# Patient Record
Sex: Female | Born: 1995 | Race: Black or African American | Hispanic: No | Marital: Single | Smoking: Never smoker
Health system: Southern US, Community
[De-identification: ages and names within clinical notes are randomized; demographics above are authoritative.]

## PROBLEM LIST (undated history)

## (undated) ENCOUNTER — Inpatient Hospital Stay (HOSPITAL_COMMUNITY): Payer: Self-pay

## (undated) ENCOUNTER — Inpatient Hospital Stay (HOSPITAL_COMMUNITY): Payer: Medicaid Other

## (undated) DIAGNOSIS — Z202 Contact with and (suspected) exposure to infections with a predominantly sexual mode of transmission: Secondary | ICD-10-CM

## (undated) DIAGNOSIS — Z8619 Personal history of other infectious and parasitic diseases: Secondary | ICD-10-CM

## (undated) HISTORY — PX: NO PAST SURGERIES: SHX2092

## (undated) HISTORY — DX: Personal history of other infectious and parasitic diseases: Z86.19

---
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
## 2017-09-27 ENCOUNTER — Inpatient Hospital Stay (HOSPITAL_COMMUNITY)
Admission: AD | Admit: 2017-09-27 | Discharge: 2017-09-27 | Disposition: A | Discharge status: Home / Self Care | Payer: Medicaid Other | Source: Ambulatory Visit | Attending: Obstetrics and Gynecology | Admitting: Obstetrics and Gynecology

## 2017-09-27 ENCOUNTER — Encounter (HOSPITAL_COMMUNITY): Payer: Self-pay | Admitting: *Deleted

## 2017-09-27 ENCOUNTER — Inpatient Hospital Stay (HOSPITAL_COMMUNITY): Payer: Medicaid Other

## 2017-09-27 DIAGNOSIS — M549 Dorsalgia, unspecified: Secondary | ICD-10-CM | POA: Insufficient documentation

## 2017-09-27 DIAGNOSIS — R1084 Generalized abdominal pain: Secondary | ICD-10-CM

## 2017-09-27 DIAGNOSIS — R109 Unspecified abdominal pain: Secondary | ICD-10-CM

## 2017-09-27 DIAGNOSIS — R1110 Vomiting, unspecified: Secondary | ICD-10-CM | POA: Insufficient documentation

## 2017-09-27 DIAGNOSIS — Z825 Family history of asthma and other chronic lower respiratory diseases: Secondary | ICD-10-CM | POA: Diagnosis not present

## 2017-09-27 LAB — URINALYSIS, ROUTINE W REFLEX MICROSCOPIC
Bilirubin Urine: NEGATIVE
GLUCOSE, UA: NEGATIVE mg/dL
Hgb urine dipstick: NEGATIVE
Ketones, ur: 5 mg/dL — AB
Leukocytes, UA: NEGATIVE
NITRITE: NEGATIVE
Protein, ur: 30 mg/dL — AB
SPECIFIC GRAVITY, URINE: 1.026 (ref 1.005–1.030)
pH: 5 (ref 5.0–8.0)

## 2017-09-27 LAB — POCT PREGNANCY, URINE: Preg Test, Ur: NEGATIVE

## 2017-09-27 LAB — WET PREP, GENITAL
Sperm: NONE SEEN
Trich, Wet Prep: NONE SEEN
Yeast Wet Prep HPF POC: NONE SEEN

## 2017-09-27 LAB — COMPREHENSIVE METABOLIC PANEL
ALK PHOS: 53 U/L (ref 38–126)
ALT: 15 U/L (ref 0–44)
ANION GAP: 11 (ref 5–15)
AST: 17 U/L (ref 15–41)
Albumin: 4.2 g/dL (ref 3.5–5.0)
BILIRUBIN TOTAL: 1.2 mg/dL (ref 0.3–1.2)
BUN: 8 mg/dL (ref 6–20)
CO2: 21 mmol/L — ABNORMAL LOW (ref 22–32)
Calcium: 9 mg/dL (ref 8.9–10.3)
Chloride: 102 mmol/L (ref 98–111)
Creatinine, Ser: 0.63 mg/dL (ref 0.44–1.00)
GFR calc non Af Amer: >60 mL/min (ref >60)
Glucose, Bld: 106 mg/dL — ABNORMAL HIGH (ref 70–99)
Potassium: 3.6 mmol/L (ref 3.5–5.1)
Sodium: 134 mmol/L — ABNORMAL LOW (ref 135–145)
TOTAL PROTEIN: 8.3 g/dL — AB (ref 6.5–8.1)

## 2017-09-27 LAB — CBC
HCT: 37.2 % (ref 36.0–46.0)
HEMOGLOBIN: 12.9 g/dL (ref 12.0–15.0)
MCH: 29.4 pg (ref 26.0–34.0)
MCHC: 34.7 g/dL (ref 30.0–36.0)
MCV: 84.7 fL (ref 78.0–100.0)
Platelets: 320 10*3/uL (ref 150–400)
RBC: 4.39 MIL/uL (ref 3.87–5.11)
RDW: 12.3 % (ref 11.5–15.5)
WBC: 12.3 10*3/uL — AB (ref 4.0–10.5)

## 2017-09-27 MED ORDER — IBUPROFEN 800 MG PO TABS
400.0000 mg | ORAL_TABLET | Freq: Once | ORAL | Status: AC
  Administered 2017-09-27: 400 mg via ORAL
  Filled 2017-09-27: qty 1

## 2017-09-27 MED ORDER — FLEET ENEMA 7-19 GM/118ML RE ENEM: 1.0000 | ENEMA | Freq: Once | RECTAL | Status: DC

## 2017-09-27 MED ORDER — FLEET ENEMA 7-19 GM/118ML RE ENEM
1.0000 | ENEMA | Freq: Once | RECTAL | Status: AC
  Administered 2017-09-27: 1 via RECTAL

## 2017-09-27 NOTE — MAU Provider Note (Signed)
Patient Linda Aguirre is a 22 y.o. G11P2002 Non-pregnant female here with complaints of abdominal pain and throwing up since last night at 8 pm. She denies fever, chills, HA, dysuria, abnormal discharge or other ob-gyn complaint.  History     CSN: [id number]  Arrival date and time: 09/27/17 1341   None     Chief Complaint  Patient presents with  . Abdominal Pain  . Back Pain  . Emesis   Abdominal Pain  This is a new problem. The current episode started yesterday. The onset quality is sudden. The problem occurs intermittently. The pain is located in the generalized abdominal region. The pain is at a severity of 8/10. The quality of the pain is cramping. The abdominal pain does not radiate. Associated symptoms include vomiting.  Back Pain  Associated symptoms include abdominal pain.  Emesis   Associated symptoms include abdominal pain.   The patient [place] 3, and her pain started last night at 8pm.  The patient has chronic back pain; this is not new pain for her. SHe is also constipated; she says that she has not had a BM for the past three days.   OB History    Gravida  2   Para  2   Term  2   Preterm      AB      Living  2     SAB      TAB      Ectopic      Multiple  0   Live Births  2           Past Medical History:  Diagnosis Date  . Chlamydia contact, treated   . H/O varicella     Past Surgical History:  Procedure Laterality Date  . NO PAST SURGERIES      Family History  Problem Relation Age of Onset  . Asthma Brother     Social History   Tobacco Use  . Smoking status: Never Smoker  . Smokeless tobacco: Never Used  Substance Use Topics  . Alcohol use: No  . Drug use: No    Types: Marijuana    Comment: last use two weeks ago( July 2015)    Allergies: No Known Allergies  Medications Prior to Admission  Medication Sig Dispense Refill Last Dose  . ibuprofen (ADVIL,MOTRIN) 600 MG tablet Take 1  tablet (600 mg total) by mouth every 6 (six) hours. 30 tablet 0 09/27/2017 at Unknown time  . cephALEXin (KEFLEX) 500 MG capsule Take 1 capsule (500 mg total) 4 (four) times daily by mouth. 20 capsule 0 More than a month at Unknown time  . cephALEXin (KEFLEX) 500 MG capsule Take 1 capsule (500 mg total) by mouth 4 (four) times daily. 20 capsule 0 More than a month at Unknown time  . docusate sodium (COLACE) 100 MG capsule Take 1 capsule (100 mg total) by mouth 2 (two) times daily. (Patient not taking: Reported on 11/25/2016) 30 capsule 0 More than a month at Unknown time  . Iron-FA-B Cmp-C-Biot-Probiotic (FUSION PLUS) CAPS Take 1 tablet by mouth 2 (two) times daily. (Patient not taking: Reported on 02/07/2016) 60 capsule 2 More than a month at Unknown time  . oxyCODONE-acetaminophen (PERCOCET/ROXICET) 5-325 MG tablet Take 1 tablet every 6 (six) hours as needed by mouth for severe pain. 15 tablet 0 More than a month at Unknown time  . Prenat w/o A-FeCbn-Meth-FA-DHA (PRENATE MINI) 29-0.6-0.4-350 MG CAPS Take  1 capsule by mouth daily before breakfast. (Patient not taking: Reported on 11/25/2016) 90 capsule 3 More than a month at Unknown time    Review of Systems  Constitutional: Negative.   HENT: Negative.   Respiratory: Negative.   Cardiovascular: Negative.   Gastrointestinal: Positive for abdominal pain and vomiting.  Genitourinary: Negative.   Musculoskeletal: Positive for back pain.  Neurological: Negative.   Hematological: Negative.    Physical Exam   Blood pressure 111/76, pulse 84, temperature 98.4 F (36.9 C), temperature source Oral, resp. rate 17, weight 74.8 kg, SpO2 97 %, not currently breastfeeding.  Physical Exam  Constitutional: She is oriented to person, place, and time. She appears well-developed.  HENT:  Head: Normocephalic.  Eyes: Pupils are equal, round, and reactive to light.  Neck: Normal range of motion.  Respiratory: Effort normal.  GI: Soft.  Musculoskeletal: Normal  range of motion.  Neurological: She is alert and oriented to person, place, and time.  Skin: Skin is warm and dry.    MAU Course  Procedures  MDM -CBC and CMP normal.  -vomited earlier in her MAU stay but no vomiting for the past hour -Abdominal pain now resolved with 60 mg of ibuprofen.  -Enema: patient feels relief.  -Pelvic US benign; no signs of torsion or cysts on ovaries.  Assessment and Plan   1. Generalized abdominal pain   2. Abdominal pain    2. Patient stable for discharge; recommended she try BRAT diet, increased hydration. Return to Urgent Care or [facility] ED if symptoms worsens.   3. All questions answered; patient stable for discharge.  Charlesetta Garibaldi Arth Nicastro 09/27/2017, 2:28 PM

## 2017-09-27 NOTE — Discharge Instructions (Signed)
Food Poisoning Food poisoning is an illness that is caused by eating or drinking contaminated foods or drinks. Food poisoning is usually mild and lasts 1-2 days. Foods and drinks can become contaminated because of:  Poor personal hygiene, such as not washing hands well enough or often.  Not storing food properly. For example, not refrigerating raw meat.  Serving, preparing, and storing food on surfaces that are not clean.  Cooking or eating with utensils that are not clean.  The most common causes of this condition are:  Viruses.  Bacteria.  Parasites.  Follow these instructions at home: Eating and drinking   Drink enough fluids to keep your pee (urine) clear or pale yellow. You may need to drink small amounts of clear liquids often.  Avoid: ? Milk. ? Caffeine. ? Alcohol.  Ask your doctor exactly how you should get enough fluid in your body (rehydrate).  Eat small meals often rather than eating large meals. Medicines  Take over-the-counter and prescription medicines only as told by your doctor. Ask your doctor if you should continue to take any of your regular prescribed and over-the-counter medicines.  If you were prescribed an antibiotic medicine, take it as told by your doctor. Do not stop taking the antibiotic even if you start to feel better. General instructions   Wash your hands fully before you prepare food and after you go to the bathroom (use the toilet). Make sure people who live with you also wash their hands often.  Clean surfaces that you touch with a product that contains chlorine bleach.  Keep all follow-up visits as told by your doctor. This is important. How is this prevented?  Wash your hands, food preparation surfaces, and utensils before and after you handle raw foods.  Use separate food preparation surfaces and storage spaces for raw meat and for fruits and vegetables.  Keep refrigerated foods colder than 40F (5C).  Serve hot foods right  away or keep them heated above 140F (60C).  Store dry foods in cool, dry spaces. Keep them away from too much heat or moisture.  Throw out any foods that: ? Do not smell right. ? Are in cans that are bulging.  Follow approved canning procedures.  Heat canned foods fully before you taste them.  Drink bottled or sterile water when you travel. Get help right away if: Call 911 or go to the emergency room if:  You have trouble: ? Breathing. ? Swallowing. ? Talking. ? Moving.  You have blurry vision.  You cannot eat or drink without throwing up (vomiting).  You pass out (faint).  Your eyes turn yellow.  You continue to throw up or have watery poop (diarrhea).  You start to have pain in your belly (abdomen), your belly pain gets worse, or your belly pain stays in one small area.  You have a fever.  You have blood or mucus in your poop (stools), or your poop looks dark black and tarry.  You have signs of dehydration, such as: ? Dark pee, very little pee, or no pee. ? Cracked lips. ? Not making tears while crying. ? Dry mouth. ? Sunken eyes. ? Sleepiness. ? Weakness. ? Dizziness.  This information is not intended to replace advice given to you by your health care provider. Make sure you discuss any questions you have with your health care provider. Document Released: 06/26/2009 Document Revised: 06/14/2015 Document Reviewed: 07/10/2014 Elsevier Interactive Patient Education  2018 [employer]  

## 2017-09-27 NOTE — MAU Note (Addendum)
+  lower/upperabdominal pain + back pain; middle Last took ibuprofen this am with no relief Has had issues with back pain in the past States abdominal pain started last night; intermittent; cramping Rating pain 10/10  +emesis Yesterday started Reports emesis x5 in the past 24 hours Denies being around anyone sick  LMP about a week ago?  Pt states she is unsure about the date No vaginal discharge or bleeding Asking when she will get pain medication  Vitals:   09/27/17 1411  BP: 111/76  Pulse: 84  Resp: 17  Temp: 98.4 F (36.9 C)  SpO2: 97%

## 2017-09-28 LAB — GC/CHLAMYDIA PROBE AMP (~~LOC~~) NOT AT ARMC
Chlamydia: NEGATIVE
Neisseria Gonorrhea: NEGATIVE

## 2017-11-21 ENCOUNTER — Inpatient Hospital Stay (HOSPITAL_COMMUNITY)
Admission: EM | Admit: 2017-11-21 | Discharge: 2017-11-22 | DRG: 419 | Disposition: A | Discharge status: Home / Self Care | Payer: Medicaid Other | Attending: Surgery | Admitting: Surgery

## 2017-11-21 ENCOUNTER — Encounter (HOSPITAL_COMMUNITY): Payer: Self-pay | Admitting: Emergency Medicine

## 2017-11-21 ENCOUNTER — Emergency Department (HOSPITAL_COMMUNITY): Payer: Medicaid Other

## 2017-11-21 DIAGNOSIS — K8000 Calculus of gallbladder with acute cholecystitis without obstruction: Secondary | ICD-10-CM | POA: Diagnosis present

## 2017-11-21 DIAGNOSIS — R10811 Right upper quadrant abdominal tenderness: Secondary | ICD-10-CM

## 2017-11-21 DIAGNOSIS — K810 Acute cholecystitis: Secondary | ICD-10-CM | POA: Diagnosis present

## 2017-11-21 LAB — CBC
HEMATOCRIT: 36.6 % (ref 36.0–46.0)
HEMOGLOBIN: 12.2 g/dL (ref 12.0–15.0)
MCH: 29 pg (ref 26.0–34.0)
MCHC: 33.3 g/dL (ref 30.0–36.0)
MCV: 87.1 fL (ref 80.0–100.0)
Platelets: 286 10*3/uL (ref 150–400)
RBC: 4.2 MIL/uL (ref 3.87–5.11)
RDW: 11.9 % (ref 11.5–15.5)
WBC: 11.3 10*3/uL — AB (ref 4.0–10.5)
nRBC: 0 % (ref 0.0–0.2)

## 2017-11-21 LAB — COMPREHENSIVE METABOLIC PANEL
ALT: 22 U/L (ref 0–44)
ANION GAP: 6 (ref 5–15)
AST: 20 U/L (ref 15–41)
Albumin: 4.1 g/dL (ref 3.5–5.0)
Alkaline Phosphatase: 48 U/L (ref 38–126)
BUN: 7 mg/dL (ref 6–20)
CHLORIDE: 106 mmol/L (ref 98–111)
CO2: 23 mmol/L (ref 22–32)
Calcium: 9.3 mg/dL (ref 8.9–10.3)
Creatinine, Ser: 0.71 mg/dL (ref 0.44–1.00)
GFR calc non Af Amer: >60 mL/min (ref >60)
Glucose, Bld: 118 mg/dL — ABNORMAL HIGH (ref 70–99)
POTASSIUM: 3.6 mmol/L (ref 3.5–5.1)
Sodium: 135 mmol/L (ref 135–145)
Total Bilirubin: 1.5 mg/dL — ABNORMAL HIGH (ref 0.3–1.2)
Total Protein: 7.6 g/dL (ref 6.5–8.1)

## 2017-11-21 LAB — URINALYSIS, ROUTINE W REFLEX MICROSCOPIC
BACTERIA UA: NONE SEEN
BILIRUBIN URINE: NEGATIVE
Glucose, UA: NEGATIVE mg/dL
Hgb urine dipstick: NEGATIVE
Ketones, ur: 80 mg/dL — AB
LEUKOCYTES UA: NEGATIVE
NITRITE: NEGATIVE
PROTEIN: 30 mg/dL — AB
Specific Gravity, Urine: 1.018 (ref 1.005–1.030)
pH: 5 (ref 5.0–8.0)

## 2017-11-21 LAB — LIPASE, BLOOD: LIPASE: 26 U/L (ref 11–51)

## 2017-11-21 LAB — I-STAT BETA HCG BLOOD, ED (MC, WL, AP ONLY)

## 2017-11-21 MED ORDER — ONDANSETRON HCL 4 MG/2ML IJ SOLN
4.0000 mg | Freq: Four times a day (QID) | INTRAMUSCULAR | Status: DC | PRN
  Administered 2017-11-22: 4 mg via INTRAVENOUS
  Filled 2017-11-21: qty 2

## 2017-11-21 MED ORDER — ONDANSETRON 4 MG PO TBDP
4.0000 mg | ORAL_TABLET | Freq: Once | ORAL | Status: AC | PRN
  Administered 2017-11-21: 4 mg via ORAL
  Filled 2017-11-21: qty 1

## 2017-11-21 MED ORDER — SODIUM CHLORIDE 0.9 % IV BOLUS
500.0000 mL | Freq: Once | INTRAVENOUS | Status: AC
  Administered 2017-11-21: 500 mL via INTRAVENOUS

## 2017-11-21 MED ORDER — HYDROMORPHONE HCL 1 MG/ML IJ SOLN
0.5000 mg | INTRAMUSCULAR | Status: DC | PRN
  Administered 2017-11-22 (×2): 0.5 mg via INTRAVENOUS
  Filled 2017-11-21 (×2): qty 1

## 2017-11-21 MED ORDER — TRAMADOL HCL 50 MG PO TABS: 50.0000 mg | ORAL_TABLET | Freq: Four times a day (QID) | ORAL | Status: DC | PRN

## 2017-11-21 MED ORDER — IBUPROFEN 600 MG PO TABS
600.0000 mg | ORAL_TABLET | Freq: Four times a day (QID) | ORAL | Status: DC | PRN
  Administered 2017-11-22: 600 mg via ORAL
  Filled 2017-11-21: qty 1

## 2017-11-21 MED ORDER — LACTATED RINGERS IV SOLN
INTRAVENOUS | Status: DC
  Administered 2017-11-22: 01:00:00 via INTRAVENOUS

## 2017-11-21 MED ORDER — ONDANSETRON 4 MG PO TBDP
4.0000 mg | ORAL_TABLET | Freq: Four times a day (QID) | ORAL | Status: DC | PRN
  Administered 2017-11-22: 4 mg via ORAL
  Filled 2017-11-21: qty 1

## 2017-11-21 MED ORDER — SODIUM CHLORIDE 0.9 % IV SOLN
2.0000 g | INTRAVENOUS | Status: DC
  Administered 2017-11-21: 2 g via INTRAVENOUS
  Filled 2017-11-21: qty 20

## 2017-11-21 MED ORDER — HYDROMORPHONE HCL 1 MG/ML IJ SOLN
0.5000 mg | Freq: Once | INTRAMUSCULAR | Status: AC
  Administered 2017-11-21: 0.5 mg via INTRAVENOUS
  Filled 2017-11-21: qty 1

## 2017-11-21 MED ORDER — ONDANSETRON HCL 4 MG/2ML IJ SOLN
4.0000 mg | Freq: Once | INTRAMUSCULAR | Status: AC
  Administered 2017-11-21: 4 mg via INTRAVENOUS
  Filled 2017-11-21: qty 2

## 2017-11-21 MED ORDER — MORPHINE SULFATE (PF) 4 MG/ML IV SOLN
4.0000 mg | Freq: Once | INTRAVENOUS | Status: AC
  Administered 2017-11-21: 4 mg via INTRAVENOUS
  Filled 2017-11-21: qty 1

## 2017-11-21 MED ORDER — ACETAMINOPHEN 500 MG PO TABS
1000.0000 mg | ORAL_TABLET | Freq: Four times a day (QID) | ORAL | Status: DC
  Administered 2017-11-21 – 2017-11-22 (×2): 1000 mg via ORAL
  Filled 2017-11-21 (×2): qty 2

## 2017-11-21 NOTE — ED Triage Notes (Addendum)
Pt states she has had intermittent nausea for 2 weeks that stopped and came back last night. She is unsure when her last BM was. She has pain to mid abdomen. Pt c.o. Stomach cramping.

## 2017-11-21 NOTE — H&P (Signed)
CC/Reason for consult: Possible acute cholecystitis; consult by Cortni Couture PA-C  HPI: Linda Aguirre is an 22 y.o. female with no known PMH presented to ED today with RUQ & MEG abd pain that started suddenly at 0400. Pain is sharp and constant. Radiates into her back. Has had numerous bouts of nausea/vomiting today. Had similar episode 2 wks ago but less severe. She has a intermittent hx of RUQ pain following greasy/fatty meals as well. Denies changes in her bowel habits.  Past Medical History:  Diagnosis Date  . Chlamydia contact, treated   . H/O varicella     Past Surgical History:  Procedure Laterality Date  . NO PAST SURGERIES      Family History  Problem Relation Age of [place]  . Asthma Brother     Social:  reports that she has never smoked. She has never used smokeless tobacco. She reports that she does not drink alcohol or use drugs.  Allergies: No Known Allergies  Medications: I have reviewed the patient's current medications.  Results for orders placed or performed during the hospital encounter of 11/21/17 (from the past 48 hour(s))  Lipase, blood     Status: None   Collection Time: 11/21/17  4:59 PM  Result Value Ref Range   Lipase 26 11 - 51 U/L    Comment: Performed at [facility], [place] [address], [place], [place] [address]  Comprehensive metabolic panel     Status: Abnormal   Collection Time: 11/21/17  4:59 PM  Result Value Ref Range   Sodium 135 135 - 145 mmol/L   Potassium 3.6 3.5 - 5.1 mmol/L   Chloride 106 98 - 111 mmol/L   CO2 23 22 - 32 mmol/L   Glucose, Bld 118 (H) 70 - 99 mg/dL   BUN 7 6 - 20 mg/dL   Creatinine, Ser 0.71 0.44 - 1.00 mg/dL   Calcium 9.3 8.9 - 10.3 mg/dL   Total Protein 7.6 6.5 - 8.1 g/dL   Albumin 4.1 3.5 - 5.0 g/dL   AST 20 15 - 41 U/L   ALT 22 0 - 44 U/L   Alkaline Phosphatase 48 38 - 126 U/L   Total Bilirubin 1.5 (H) 0.3 - 1.2 mg/dL   GFR calc non Af Amer >60 >60 mL/min   GFR calc Af Amer >60 >60 mL/min   Comment: (NOTE) The eGFR has been calculated using the CKD EPI equation. This calculation has not been validated in all clinical situations. eGFR's persistently <60 mL/min signify possible Chronic Kidney Disease.    Anion gap 6 5 - 15    Comment: Performed at [facility] [address], [place], [place] [address]  CBC     Status: Abnormal   Collection Time: 11/21/17  4:59 PM  Result Value Ref Range   WBC 11.3 (H) 4.0 - 10.5 K/uL   RBC 4.20 3.87 - 5.11 MIL/uL   Hemoglobin 12.2 12.0 - 15.0 g/dL   HCT 36.6 36.0 - 46.0 %   MCV 87.1 80.0 - 100.0 fL   MCH 29.0 26.0 - 34.0 pg   MCHC 33.3 30.0 - 36.0 g/dL   RDW 11.9 11.5 - 15.5 %   Platelets 286 150 - 400 K/uL   nRBC 0.0 0.0 - 0.2 %    Comment: Performed at [facility], [place] [address], [place], [place] [address]  Urinalysis, Routine w reflex microscopic     Status: Abnormal   Collection Time: 11/21/17  5:03 PM  Result Value Ref Range   Color, Urine YELLOW YELLOW   APPearance CLEAR CLEAR   Specific Gravity, Urine 1.018 1.005 - 1.030   pH 5.0 5.0 - 8.0   Glucose, UA NEGATIVE NEGATIVE mg/dL   Hgb urine dipstick NEGATIVE NEGATIVE   Bilirubin Urine NEGATIVE NEGATIVE   Ketones, ur 80 (A) NEGATIVE mg/dL   Protein, ur 30 (A) NEGATIVE mg/dL   Nitrite NEGATIVE NEGATIVE   Leukocytes, UA NEGATIVE NEGATIVE   RBC / HPF 0-5 0 - 5 RBC/hpf   WBC, UA 0-5 0 - 5 WBC/hpf   Bacteria, UA NONE SEEN NONE SEEN   Squamous Epithelial / LPF 0-5 0 - 5   Mucus PRESENT     Comment: Performed at [facility] [address] [place], [place] [address]  I-Stat beta hCG blood, ED     Status: None   Collection Time: 11/21/17  5:21 PM  Result Value Ref Range   I-stat hCG, quantitative <5.0 <5 mIU/mL   Comment 3            Comment:   GEST. AGE      CONC.  (mIU/mL)   <=1 WEEK        5 - 50     2 WEEKS       50 - 500     3 WEEKS       100 - 10,000     4 WEEKS     1,000 - 30,000        FEMALE AND NON-PREGNANT FEMALE:     LESS THAN 5 mIU/mL       US Abdomen Limited Ruq  Result Date: 11/21/2017 CLINICAL DATA:  Right upper quadrant pain and nausea for 2 weeks EXAM: ULTRASOUND ABDOMEN LIMITED RIGHT UPPER QUADRANT COMPARISON:  None. FINDINGS: Gallbladder: There are multiple gallstones present measuring up to 6.6 mm. There is no pericholecystic fluid or gallbladder wall thickening. Sonographic Percell Miller sign could not be assessed because the patient had already received pain medication. Common bile duct: Diameter: 5 mm Liver: No focal lesion identified. Within normal limits in parenchymal echogenicity. Portal vein is patent on color Doppler imaging with normal direction of blood flow towards the liver. IMPRESSION: Cholelithiasis without other evidence of acute cholecystitis. Unable to assess sonographic Murphy sign due to previously administered pain medication. Electronically Signed   By: Ulyses Jarred M.D.   On: 11/21/2017 20:40    ROS - all of the below systems have been reviewed with the patient and positives are indicated with bold text General: chills, fever or night sweats Eyes: blurry vision or double vision ENT: epistaxis or sore throat Allergy/Immunology: itchy/watery eyes or nasal congestion Hematologic/Lymphatic: bleeding problems, blood clots or swollen lymph nodes Endocrine: temperature intolerance or unexpected weight changes Breast: new or changing breast lumps or nipple discharge Resp: cough, shortness of breath, or wheezing CV: chest pain or dyspnea on exertion GI: as per HPI GU: dysuria, trouble voiding, or hematuria MSK: joint pain or joint stiffness Neuro: TIA or stroke symptoms Derm: pruritus and skin lesion changes Psych: anxiety and depression  PE Blood pressure 116/74, pulse 67, temperature 98.6 F (37 C), temperature source Oral, resp. rate 16, height '4\' 11"'  (1.499 m), weight 63.5 kg, last menstrual period 11/18/2017, SpO2 100 %, not currently breastfeeding. Constitutional: NAD; conversant; no  deformities Eyes: Moist conjunctiva; no lid lag; anicteric; PERRL Neck: Trachea midline; no thyromegaly Lungs: Normal respiratory effort; no tactile fremitus CV: RRR; no palpable thrills; no pitting edema  GI: Abd soft, mildly ttp in RUQ; no tenderness elsewhere; nondistended; no palpable hepatosplenomegaly MSK: Normal gait; no clubbing/cyanosis Psychiatric: Appropriate affect; alert and oriented x3 Lymphatic: No palpable cervical or axillary lymphadenopathy  Results for orders placed or performed during the hospital encounter of 11/21/17 (from the past 48 hour(s))  Lipase, blood     Status: None   Collection Time: 11/21/17  4:59 PM  Result Value Ref Range   Lipase 26 11 - 51 U/L    Comment: Performed at [facility], [address], [place], [place] [address]  Comprehensive metabolic panel     Status: Abnormal   Collection Time: 11/21/17  4:59 PM  Result Value Ref Range   Sodium 135 135 - 145 mmol/L   Potassium 3.6 3.5 - 5.1 mmol/L   Chloride 106 98 - 111 mmol/L   CO2 23 22 - 32 mmol/L   Glucose, Bld 118 (H) 70 - 99 mg/dL   BUN 7 6 - 20 mg/dL   Creatinine, Ser 0.71 0.44 - 1.00 mg/dL   Calcium 9.3 8.9 - 10.3 mg/dL   Total Protein 7.6 6.5 - 8.1 g/dL   Albumin 4.1 3.5 - 5.0 g/dL   AST 20 15 - 41 U/L   ALT 22 0 - 44 U/L   Alkaline Phosphatase 48 38 - 126 U/L   Total Bilirubin 1.5 (H) 0.3 - 1.2 mg/dL   GFR calc non Af Amer >60 >60 mL/min   GFR calc Af Amer >60 >60 mL/min    Comment: (NOTE) The eGFR has been calculated using the CKD EPI equation. This calculation has not been validated in all clinical situations. eGFR's persistently <60 mL/min signify possible Chronic Kidney Disease.    Anion gap 6 5 - 15    Comment: Performed at [facility] [address] [place], [place] [address]  CBC     Status: Abnormal   Collection Time: 11/21/17  4:59 PM  Result Value Ref Range   WBC 11.3 (H) 4.0 - 10.5 K/uL   RBC 4.20 3.87 - 5.11 MIL/uL   Hemoglobin 12.2 12.0 - 15.0  g/dL   HCT 36.6 36.0 - 46.0 %   MCV 87.1 80.0 - 100.0 fL   MCH 29.0 26.0 - 34.0 pg   MCHC 33.3 30.0 - 36.0 g/dL   RDW 11.9 11.5 - 15.5 %   Platelets 286 150 - 400 K/uL   nRBC 0.0 0.0 - 0.2 %    Comment: Performed at [facility], [place] [address], [place], [place] [address]  Urinalysis, Routine w reflex microscopic     Status: Abnormal   Collection Time: 11/21/17  5:03 PM  Result Value Ref Range   Color, Urine YELLOW YELLOW   APPearance CLEAR CLEAR   Specific Gravity, Urine 1.018 1.005 - 1.030   pH 5.0 5.0 - 8.0   Glucose, UA NEGATIVE NEGATIVE mg/dL   Hgb urine dipstick NEGATIVE NEGATIVE   Bilirubin Urine NEGATIVE NEGATIVE   Ketones, ur 80 (A) NEGATIVE mg/dL   Protein, ur 30 (A) NEGATIVE mg/dL   Nitrite NEGATIVE NEGATIVE   Leukocytes, UA NEGATIVE NEGATIVE   RBC / HPF 0-5 0 - 5 RBC/hpf   WBC, UA 0-5 0 - 5 WBC/hpf   Bacteria, UA NONE SEEN NONE SEEN   Squamous Epithelial / LPF 0-5 0 - 5   Mucus PRESENT     Comment: Performed at [facility], [place] [address], [place], [place] [address]  I-Stat beta  hCG blood, ED     Status: None   Collection Time: 11/21/17  5:21 PM  Result Value Ref Range   I-stat hCG, quantitative <5.0 <5 mIU/mL   Comment 3            Comment:   GEST. AGE      CONC.  (mIU/mL)   <=1 WEEK        5 - 50     2 WEEKS       50 - 500     3 WEEKS       100 - 10,000     4 WEEKS     1,000 - 30,000        FEMALE AND NON-PREGNANT FEMALE:     LESS THAN 5 mIU/mL     US Abdomen Limited Ruq  Result Date: 11/21/2017 CLINICAL DATA:  Right upper quadrant pain and nausea for 2 weeks EXAM: ULTRASOUND ABDOMEN LIMITED RIGHT UPPER QUADRANT COMPARISON:  None. FINDINGS: Gallbladder: There are multiple gallstones present measuring up to 6.6 mm. There is no pericholecystic fluid or gallbladder wall thickening. Sonographic Percell Miller sign could not be assessed because the patient had already received pain medication. Common bile duct: Diameter: 5 mm Liver: No focal lesion  identified. Within normal limits in parenchymal echogenicity. Portal vein is patent on color Doppler imaging with normal direction of blood flow towards the liver. IMPRESSION: Cholelithiasis without other evidence of acute cholecystitis. Unable to assess sonographic Murphy sign due to previously administered pain medication. Electronically Signed   By: Ulyses Jarred M.D.   On: 11/21/2017 20:40   A/P: Chauntel Windsor is an 22 y.o. female with likely acute cholecystitis given her persistent symptoms  -Admit to surgery -Clear liquids; NPO after midnight -Repeat LFTs in AM -The anatomy and physiology of the hepatobiliary system was discussed at length with the patient. The pathophysiology of gallbladder disease was discussed at length with her as well. -The options for treatment were discussed including ongoing observation which may result in subsequent gallbladder complications (infection, pancreatitis, choledocholithiasis, etc) and surgery - she opted to pursue surgery -The planned procedure, material risks (including, but not limited to, pain, bleeding, infection, scarring, need for blood transfusion, damage to surrounding structures- blood vessels/nerves/viscus/organs, damage to bile duct, bile leak, need for additional procedures, hernia, worsening of pre-existing medical conditions, pancreatitis, pneumonia, heart attack, stroke, death) benefits and alternatives to surgery were discussed at length. I noted a good probability that the procedure would help improve their symptoms. The patient's questions were answered to their satisfaction, they voiced understanding and they elected to proceed with surgery. Additionally, we discussed typical postoperative expectations and the recovery process.  Sharon Mt. Dema Severin, M.D. [facility]

## 2017-11-21 NOTE — ED Notes (Signed)
Patient transported to [facility] 

## 2017-11-21 NOTE — ED Provider Notes (Signed)
[facility] EMERGENCY DEPARTMENT Provider Note   CSN: [id number] Arrival date & time: 11/21/17  1637     History   Chief Complaint Chief Complaint  Patient presents with  . Abdominal Pain  . Emesis    HPI Linda Aguirre is a 22 y.o. female.  HPI  Pt is a 22 y/o female who presents to the ED today c/o abd pain RUQ and epigastric abd pain that has been present since 4:00am this morning. Pain has been constant. Pain is rated at 10/10. Pain is worse when she leans forward. Pain radiates to her back.   States she has nausea and vomiting (x15-20) today as well.  States that she ate spicy foods last night before she went to bed.  She states that she had a similar episode about 2 weeks ago with pain in the same area that was associated with nausea and vomiting.  This pain occurred after eating as well.  Denies hematemesis or diarrhea. Reports constipation. Last BM was 2 days ago. Denies fevers, urinary or vaginal complaints. She denies chest pain or shortness of breath.   Records reviewed.  Patient had normal pelvic ultrasound September 2019.  Past Medical History:  Diagnosis Date  . Chlamydia contact, treated   . H/O varicella     Patient Active Problem List   Diagnosis Date Noted  . NSVD (normal spontaneous vaginal delivery) 02/10/2016  . Nexplanon insertion 02/10/2016  . Pregnancy 02/07/2016  . Chlamydia infection affecting pregnancy in third trimester 01/24/2016  . Rh negative state in antepartum period 01/24/2016  . GBS (group B Streptococcus carrier), +RV culture, currently pregnant 01/18/2016  . Anemia affecting pregnancy in third trimester 12/11/2015  . Encounter for supervision of normal pregnancy 11/20/2015  . Pregnancy test positive 08/02/2014  . Short interval between pregnancies affecting pregnancy in third trimester, antepartum 08/02/2014  . Teen pregnancy 08/02/2014  . Adolescent pregnancy 04/10/2014  . Maternal varicella, non-immune 10/22/2013     Past Surgical History:  Procedure Laterality Date  . NO PAST SURGERIES       OB History    Gravida  2   Para  2   Term  2   Preterm      AB      Living  2     SAB      TAB      Ectopic      Multiple  0   Live Births  2            Home Medications    Prior to Admission medications   Medication Sig Start Date End Date Taking? Authorizing Provider  docusate sodium (COLACE) 100 MG capsule Take 1 capsule (100 mg total) by mouth 2 (two) times daily. Patient not taking: Reported on 11/25/2016 02/10/16   Mumaw, Hiram Comber, DO  ibuprofen (ADVIL,MOTRIN) 600 MG tablet Take 1 tablet (600 mg total) by mouth every 6 (six) hours. 02/10/16   Mumaw, Hiram Comber, DO  Prenat w/o A-FeCbn-Meth-FA-DHA (PRENATE MINI) 29-0.6-0.4-350 MG CAPS Take 1 capsule by mouth daily before breakfast. Patient not taking: Reported on 11/25/2016 08/02/15   Brock Bad, MD    Family History Family History  Problem Relation Age of Onset  . Asthma Brother     Social History Social History   Tobacco Use  . Smoking status: Never Smoker  . Smokeless tobacco: Never Used  Substance Use Topics  . Alcohol use: No  . Drug use: No    Types: Marijuana  Comment: last use two weeks ago( July 2015)     Allergies   Patient has no known allergies.   Review of Systems Review of Systems  Constitutional: Negative for chills and fever.  HENT: Negative for ear pain and sore throat.   Eyes: Negative for pain and visual disturbance.  Respiratory: Negative for cough and shortness of breath.   Cardiovascular: Negative for chest pain.  Gastrointestinal: Positive for abdominal pain, constipation, nausea and vomiting. Negative for diarrhea.  Genitourinary: Negative for dysuria, flank pain, frequency, hematuria, pelvic pain, vaginal bleeding and vaginal discharge.  Musculoskeletal: Positive for back pain. Negative for neck pain.  Skin: Negative for rash.  Neurological: Negative for  headaches.  All other systems reviewed and are negative.   Physical Exam Updated Vital Signs BP 112/70   Pulse 85   Temp 98.6 F (37 C) (Oral)   Resp 16   Ht 4\' 11"  (1.499 m)   Wt 63.5 kg   LMP 11/18/2017   SpO2 100%   BMI 28.28 kg/m   Physical Exam  Constitutional: She appears well-developed and well-nourished.  Appears uncomfortable  HENT:  Head: Normocephalic and atraumatic.  Eyes: Conjunctivae are normal.  Neck: Neck supple.  Cardiovascular: Normal rate, regular rhythm, normal heart sounds and intact distal pulses.  Pulmonary/Chest: Effort normal and breath sounds normal. No stridor. No respiratory distress. She has no wheezes. She has no rales.  Abdominal: Soft.  BS present. TTP to the epigastric and RUQ areas. No rebound TTP. +voluntary guarding. No CVA TTP.  Musculoskeletal: She exhibits no edema.  Neurological: She is alert.  Skin: Skin is warm and dry. Capillary refill takes less than 2 seconds.  Psychiatric: She has a normal mood and affect.  Nursing note and vitals reviewed.   ED Treatments / Results  Labs (all labs ordered are listed, but only abnormal results are displayed) Labs Reviewed  COMPREHENSIVE METABOLIC PANEL - Abnormal; Notable for the following components:      Result Value   Glucose, Bld 118 (*)    Total Bilirubin 1.5 (*)    All other components within normal limits  CBC - Abnormal; Notable for the following components:   WBC 11.3 (*)    All other components within normal limits  URINALYSIS, ROUTINE W REFLEX MICROSCOPIC - Abnormal; Notable for the following components:   Ketones, ur 80 (*)    Protein, ur 30 (*)    All other components within normal limits  LIPASE, BLOOD  I-STAT BETA HCG BLOOD, ED ([facility], [facility], [facility] ONLY)    EKG None  Radiology US Abdomen Limited Ruq  Result Date: 11/21/2017 CLINICAL DATA:  Right upper quadrant pain and nausea for 2 weeks EXAM: ULTRASOUND ABDOMEN LIMITED RIGHT UPPER QUADRANT COMPARISON:  None. FINDINGS:  Gallbladder: There are multiple gallstones present measuring up to 6.6 mm. There is no pericholecystic fluid or gallbladder wall thickening. Sonographic Eulah Pont sign could not be assessed because the patient had already received pain medication. Common bile duct: Diameter: 5 mm Liver: No focal lesion identified. Within normal limits in parenchymal echogenicity. Portal vein is patent on color Doppler imaging with normal direction of blood flow towards the liver. IMPRESSION: Cholelithiasis without other evidence of acute cholecystitis. Unable to assess sonographic Murphy sign due to previously administered pain medication. Electronically Signed   By: Deatra Robinson M.D.   On: 11/21/2017 20:40    Procedures Procedures (including critical care time)  Medications Ordered in ED Medications  ondansetron (ZOFRAN-ODT) disintegrating tablet 4 mg (4 mg  Oral Given 11/21/17 1702)  morphine 4 MG/ML injection 4 mg (4 mg Intravenous Given 11/21/17 2018)  sodium chloride 0.9 % bolus 500 mL (0 mLs Intravenous Stopped 11/21/17 2144)  ondansetron (ZOFRAN) injection 4 mg (4 mg Intravenous Given 11/21/17 2019)  HYDROmorphone (DILAUDID) injection 0.5 mg (0.5 mg Intravenous Given 11/21/17 2153)     Initial Impression / Assessment and Plan / ED Course  I have reviewed the triage vital signs and the nursing notes.  Pertinent labs & imaging results that were available during my care of the patient were reviewed by me and considered in my medical decision making (see chart for details).    Discussed pt presentation and exam findings with Dr. Criss Alvine, who agrees with the plan to consult general surgery.   Final Clinical Impressions(s) / ED Diagnoses   Final diagnoses:  RUQ abdominal tenderness   Patient presenting with right upper quadrant and epigastric pain beginning at 4 AM this morning.  Associated with nausea and vomiting.  Ate spicy food last night.  Had additional episode about 2 weeks ago after eating.  No fevers  or chills.  Vital signs stable today.  CBC with mild leukocytosis to 11.3.  No anemia.  CMP with normal electrolytes, kidney and liver function.  Total bilirubin is slightly elevated at 1.5.  Lipase is normal.  Pregnancy test negative.  UA with proteinuria and ketonuria.  No leuks or nitrites.  Given her elevated bilirubin and right upper quadrant tenderness, will obtain right upper quadrant ultrasound to rule out gallbladder pathology.   Fluid bolus, antiemetics and pain medications given in the ED.   RUQ ultrasound with cholelithiasis but no findings of cholecystitis.   Reevalauted pt and discussed findings. She states that her pain improved during [place], but it has now returned and she rates it 7/10. Denies continued vomiting. Given elevated WBC, total bilirubin, and persistent pain question biliary cholic versus early cholecystitis. Will consult general surgery for recommendations.  9:34 PM CONSULT with Dr. Cliffton Asters with general surgery who will evaluate the pt in the ED.   Evaluated the patient in the ED and recommended admission to surgical service for cholecystectomy.  ED Discharge Orders    None       Rayne Du 11/21/17 2219    Pricilla Loveless, MD 11/22/17 1421

## 2017-11-22 ENCOUNTER — Inpatient Hospital Stay (HOSPITAL_COMMUNITY): Payer: Medicaid Other | Admitting: Anesthesiology

## 2017-11-22 ENCOUNTER — Other Ambulatory Visit: Payer: Self-pay

## 2017-11-22 ENCOUNTER — Encounter (HOSPITAL_COMMUNITY): Admission: EM | Disposition: A | Discharge status: Home / Self Care | Payer: Self-pay | Source: Home / Self Care

## 2017-11-22 HISTORY — PX: CHOLECYSTECTOMY: SHX55

## 2017-11-22 LAB — HEPATIC FUNCTION PANEL
ALBUMIN: 3.6 g/dL (ref 3.5–5.0)
ALK PHOS: 46 U/L (ref 38–126)
ALT: 18 U/L (ref 0–44)
AST: 17 U/L (ref 15–41)
BILIRUBIN TOTAL: 1.5 mg/dL — AB (ref 0.3–1.2)
Bilirubin, Direct: 0.3 mg/dL — ABNORMAL HIGH (ref 0.0–0.2)
Indirect Bilirubin: 1.2 mg/dL — ABNORMAL HIGH (ref 0.3–0.9)
Total Protein: 7.1 g/dL (ref 6.5–8.1)

## 2017-11-22 LAB — HIV ANTIBODY (ROUTINE TESTING W REFLEX): HIV Screen 4th Generation wRfx: NONREACTIVE

## 2017-11-22 SURGERY — LAPAROSCOPIC CHOLECYSTECTOMY
Anesthesia: General | Site: Abdomen

## 2017-11-22 MED ORDER — SCOPOLAMINE 1 MG/3DAYS TD PT72
MEDICATED_PATCH | TRANSDERMAL | Status: AC
  Filled 2017-11-22: qty 1

## 2017-11-22 MED ORDER — SUCCINYLCHOLINE CHLORIDE 200 MG/10ML IV SOSY
PREFILLED_SYRINGE | INTRAVENOUS | Status: AC
  Filled 2017-11-22: qty 10

## 2017-11-22 MED ORDER — ONDANSETRON HCL 4 MG/2ML IJ SOLN
INTRAMUSCULAR | Status: AC
  Filled 2017-11-22: qty 2

## 2017-11-22 MED ORDER — SUGAMMADEX SODIUM 200 MG/2ML IV SOLN
INTRAVENOUS | Status: DC | PRN
  Administered 2017-11-22: 200 mg via INTRAVENOUS

## 2017-11-22 MED ORDER — PROPOFOL 10 MG/ML IV BOLUS
INTRAVENOUS | Status: AC
  Filled 2017-11-22: qty 40

## 2017-11-22 MED ORDER — DOCUSATE SODIUM 100 MG PO CAPS: 200.0000 mg | ORAL_CAPSULE | Freq: Two times a day (BID) | ORAL | Status: DC

## 2017-11-22 MED ORDER — HYDROMORPHONE HCL 1 MG/ML IJ SOLN
0.2500 mg | INTRAMUSCULAR | Status: DC | PRN
  Administered 2017-11-22 (×2): 0.25 mg via INTRAVENOUS

## 2017-11-22 MED ORDER — LIDOCAINE 2% (20 MG/ML) 5 ML SYRINGE
INTRAMUSCULAR | Status: AC
  Filled 2017-11-22: qty 5

## 2017-11-22 MED ORDER — ENOXAPARIN SODIUM 40 MG/0.4ML ~~LOC~~ SOLN: 40.0000 mg | SUBCUTANEOUS | Status: DC

## 2017-11-22 MED ORDER — SCOPOLAMINE 1 MG/3DAYS TD PT72
1.0000 | MEDICATED_PATCH | TRANSDERMAL | Status: DC
  Filled 2017-11-22: qty 1

## 2017-11-22 MED ORDER — MIDAZOLAM HCL 2 MG/2ML IJ SOLN
INTRAMUSCULAR | Status: AC
  Filled 2017-11-22: qty 2

## 2017-11-22 MED ORDER — CEFAZOLIN SODIUM-DEXTROSE 2-4 GM/100ML-% IV SOLN
INTRAVENOUS | Status: AC
  Filled 2017-11-22: qty 100

## 2017-11-22 MED ORDER — SUCCINYLCHOLINE CHLORIDE 20 MG/ML IJ SOLN
INTRAMUSCULAR | Status: DC | PRN
  Administered 2017-11-22: 100 mg via INTRAVENOUS

## 2017-11-22 MED ORDER — LIDOCAINE HCL (CARDIAC) PF 100 MG/5ML IV SOSY
PREFILLED_SYRINGE | INTRAVENOUS | Status: DC | PRN
  Administered 2017-11-22: 30 mg via INTRAVENOUS

## 2017-11-22 MED ORDER — LACTATED RINGERS IV SOLN
INTRAVENOUS | Status: DC | PRN
  Administered 2017-11-22: 07:00:00 via INTRAVENOUS

## 2017-11-22 MED ORDER — PROMETHAZINE HCL 25 MG/ML IJ SOLN
6.2500 mg | INTRAMUSCULAR | Status: DC | PRN
  Administered 2017-11-22: 12.5 mg via INTRAVENOUS

## 2017-11-22 MED ORDER — SIMETHICONE 80 MG PO CHEW: 40.0000 mg | CHEWABLE_TABLET | Freq: Four times a day (QID) | ORAL | Status: DC | PRN

## 2017-11-22 MED ORDER — SODIUM CHLORIDE 0.9 % IR SOLN
Status: DC | PRN
  Administered 2017-11-22: 1000 mL

## 2017-11-22 MED ORDER — MIDAZOLAM HCL 5 MG/5ML IJ SOLN
INTRAMUSCULAR | Status: DC | PRN
  Administered 2017-11-22: 2 mg via INTRAVENOUS

## 2017-11-22 MED ORDER — FENTANYL CITRATE (PF) 250 MCG/5ML IJ SOLN
INTRAMUSCULAR | Status: AC
  Filled 2017-11-22: qty 5

## 2017-11-22 MED ORDER — HYDRALAZINE HCL 20 MG/ML IJ SOLN: 10.0000 mg | INTRAMUSCULAR | Status: DC | PRN

## 2017-11-22 MED ORDER — PROPOFOL 10 MG/ML IV BOLUS
INTRAVENOUS | Status: DC | PRN
  Administered 2017-11-22: 170 mg via INTRAVENOUS

## 2017-11-22 MED ORDER — MORPHINE SULFATE (PF) 2 MG/ML IV SOLN: 1.0000 mg | INTRAVENOUS | Status: DC | PRN

## 2017-11-22 MED ORDER — DIPHENHYDRAMINE HCL 12.5 MG/5ML PO ELIX: 12.5000 mg | ORAL_SOLUTION | Freq: Four times a day (QID) | ORAL | Status: DC | PRN

## 2017-11-22 MED ORDER — ROCURONIUM BROMIDE 50 MG/5ML IV SOSY
PREFILLED_SYRINGE | INTRAVENOUS | Status: AC
  Filled 2017-11-22: qty 5

## 2017-11-22 MED ORDER — KETOROLAC TROMETHAMINE 30 MG/ML IJ SOLN
INTRAMUSCULAR | Status: DC | PRN
  Administered 2017-11-22: 30 mg via INTRAVENOUS

## 2017-11-22 MED ORDER — BUPIVACAINE-EPINEPHRINE 0.25% -1:200000 IJ SOLN
INTRAMUSCULAR | Status: DC | PRN
  Administered 2017-11-22: 9 mL

## 2017-11-22 MED ORDER — ONDANSETRON HCL 4 MG/2ML IJ SOLN: 4.0000 mg | Freq: Four times a day (QID) | INTRAMUSCULAR | Status: DC | PRN

## 2017-11-22 MED ORDER — ONDANSETRON HCL 4 MG/2ML IJ SOLN
INTRAMUSCULAR | Status: DC | PRN
  Administered 2017-11-22: 4 mg via INTRAVENOUS

## 2017-11-22 MED ORDER — BUPIVACAINE-EPINEPHRINE (PF) 0.25% -1:200000 IJ SOLN
INTRAMUSCULAR | Status: AC
  Filled 2017-11-22: qty 30

## 2017-11-22 MED ORDER — CEFAZOLIN SODIUM-DEXTROSE 2-3 GM-%(50ML) IV SOLR
INTRAVENOUS | Status: DC | PRN
  Administered 2017-11-22: 2 g via INTRAVENOUS

## 2017-11-22 MED ORDER — FENTANYL CITRATE (PF) 100 MCG/2ML IJ SOLN
INTRAMUSCULAR | Status: DC | PRN
  Administered 2017-11-22 (×2): 100 ug via INTRAVENOUS
  Administered 2017-11-22: 50 ug via INTRAVENOUS

## 2017-11-22 MED ORDER — OXYCODONE HCL 5 MG PO TABS: 5.0000 mg | ORAL_TABLET | ORAL | 0 refills | Status: AC | PRN

## 2017-11-22 MED ORDER — SCOPOLAMINE 1 MG/3DAYS TD PT72
MEDICATED_PATCH | TRANSDERMAL | Status: DC | PRN
  Administered 2017-11-22: 1 via TRANSDERMAL

## 2017-11-22 MED ORDER — ONDANSETRON 4 MG PO TBDP: 4.0000 mg | ORAL_TABLET | Freq: Four times a day (QID) | ORAL | Status: DC | PRN

## 2017-11-22 MED ORDER — 0.9 % SODIUM CHLORIDE (POUR BTL) OPTIME
TOPICAL | Status: DC | PRN
  Administered 2017-11-22: 1000 mL

## 2017-11-22 MED ORDER — ACETAMINOPHEN 500 MG PO TABS
1000.0000 mg | ORAL_TABLET | Freq: Four times a day (QID) | ORAL | Status: DC
  Administered 2017-11-22: 1000 mg via ORAL
  Filled 2017-11-22: qty 2

## 2017-11-22 MED ORDER — SODIUM CHLORIDE 0.9 % IV SOLN
INTRAVENOUS | Status: DC
  Administered 2017-11-22: 12:00:00 via INTRAVENOUS

## 2017-11-22 MED ORDER — DIPHENHYDRAMINE HCL 50 MG/ML IJ SOLN: 12.5000 mg | Freq: Four times a day (QID) | INTRAMUSCULAR | Status: DC | PRN

## 2017-11-22 MED ORDER — PROMETHAZINE HCL 25 MG/ML IJ SOLN
INTRAMUSCULAR | Status: AC
  Filled 2017-11-22: qty 1

## 2017-11-22 MED ORDER — KETOROLAC TROMETHAMINE 15 MG/ML IJ SOLN: 15.0000 mg | Freq: Four times a day (QID) | INTRAMUSCULAR | Status: DC | PRN

## 2017-11-22 MED ORDER — IOPAMIDOL (ISOVUE-300) INJECTION 61%
INTRAVENOUS | Status: AC
  Filled 2017-11-22: qty 50

## 2017-11-22 MED ORDER — HEPARIN SODIUM (PORCINE) 5000 UNIT/ML IJ SOLN: 5000.0000 [IU] | Freq: Three times a day (TID) | INTRAMUSCULAR | Status: DC

## 2017-11-22 MED ORDER — DEXAMETHASONE SODIUM PHOSPHATE 10 MG/ML IJ SOLN
INTRAMUSCULAR | Status: AC
  Filled 2017-11-22: qty 1

## 2017-11-22 MED ORDER — DEXAMETHASONE SODIUM PHOSPHATE 10 MG/ML IJ SOLN
INTRAMUSCULAR | Status: DC | PRN
  Administered 2017-11-22: 10 mg via INTRAVENOUS

## 2017-11-22 MED ORDER — OXYCODONE HCL 5 MG PO TABS
5.0000 mg | ORAL_TABLET | ORAL | Status: DC | PRN
  Administered 2017-11-22 (×2): 10 mg via ORAL
  Filled 2017-11-22 (×2): qty 2

## 2017-11-22 MED ORDER — HYDROMORPHONE HCL 1 MG/ML IJ SOLN
INTRAMUSCULAR | Status: AC
  Filled 2017-11-22: qty 1

## 2017-11-22 MED ORDER — ROCURONIUM BROMIDE 50 MG/5ML IV SOSY
PREFILLED_SYRINGE | INTRAVENOUS | Status: DC | PRN
  Administered 2017-11-22: 20 mg via INTRAVENOUS
  Administered 2017-11-22: 10 mg via INTRAVENOUS

## 2017-11-22 SURGICAL SUPPLY — 41 items
ADH SKN CLS APL DERMABOND .7 (GAUZE/BANDAGES/DRESSINGS) ×2
APPLIER CLIP 5 13 M/L LIGAMAX5 (MISCELLANEOUS) ×4
APR CLP MED LRG 5 ANG JAW (MISCELLANEOUS) ×2
BAG SPEC RTRVL 10 TROC 200 (ENDOMECHANICALS) ×2
CANISTER SUCT 3000ML PPV (MISCELLANEOUS) ×4 IMPLANT
CHLORAPREP W/TINT 26ML (MISCELLANEOUS) ×4 IMPLANT
CLIP APPLIE 5 13 M/L LIGAMAX5 (MISCELLANEOUS) ×2 IMPLANT
CLOSURE WOUND 1/2 X4 (GAUZE/BANDAGES/DRESSINGS) ×1
COVER SURGICAL LIGHT HANDLE (MISCELLANEOUS) ×4 IMPLANT
COVER WAND RF STERILE (DRAPES) ×4 IMPLANT
DERMABOND ADVANCED (GAUZE/BANDAGES/DRESSINGS) ×2
DERMABOND ADVANCED .7 DNX12 (GAUZE/BANDAGES/DRESSINGS) ×1 IMPLANT
ELECT REM PT RETURN 9FT ADLT (ELECTROSURGICAL) ×4
ELECTRODE REM PT RTRN 9FT ADLT (ELECTROSURGICAL) ×2 IMPLANT
ENDOLOOP SUT PDS II  0 18 (SUTURE) ×2
ENDOLOOP SUT PDS II 0 18 (SUTURE) ×1 IMPLANT
GLOVE BIO SURGEON STRL SZ7 (GLOVE) ×4 IMPLANT
GLOVE BIOGEL PI IND STRL 7.5 (GLOVE) ×2 IMPLANT
GLOVE BIOGEL PI INDICATOR 7.5 (GLOVE) ×2
GOWN STRL REUS W/ TWL LRG LVL3 (GOWN DISPOSABLE) ×6 IMPLANT
GOWN STRL REUS W/TWL LRG LVL3 (GOWN DISPOSABLE) ×12
GRASPER SUT TROCAR 14GX15 (MISCELLANEOUS) ×4 IMPLANT
KIT BASIN OR (CUSTOM PROCEDURE TRAY) ×4 IMPLANT
KIT TURNOVER KIT B (KITS) ×4 IMPLANT
NS IRRIG 1000ML POUR BTL (IV SOLUTION) ×4 IMPLANT
PAD ARMBOARD 7.5X6 YLW CONV (MISCELLANEOUS) ×4 IMPLANT
POUCH RETRIEVAL ECOSAC 10 (ENDOMECHANICALS) ×2 IMPLANT
POUCH RETRIEVAL ECOSAC 10MM (ENDOMECHANICALS) ×2
SCISSORS LAP 5X35 DISP (ENDOMECHANICALS) ×4 IMPLANT
SET IRRIG TUBING LAPAROSCOPIC (IRRIGATION / IRRIGATOR) ×4 IMPLANT
SLEEVE ENDOPATH XCEL 5M (ENDOMECHANICALS) ×8 IMPLANT
SPECIMEN JAR SMALL (MISCELLANEOUS) ×4 IMPLANT
STRIP CLOSURE SKIN 1/2X4 (GAUZE/BANDAGES/DRESSINGS) ×3 IMPLANT
SUT MNCRL AB 4-0 PS2 18 (SUTURE) ×7 IMPLANT
SUT VICRYL 0 UR6 27IN ABS (SUTURE) ×4 IMPLANT
TOWEL OR 17X24 6PK STRL BLUE (TOWEL DISPOSABLE) ×4 IMPLANT
TRAY LAPAROSCOPIC MC (CUSTOM PROCEDURE TRAY) ×4 IMPLANT
TROCAR XCEL BLUNT TIP 100MML (ENDOMECHANICALS) ×4 IMPLANT
TROCAR XCEL NON-BLD 5MMX100MML (ENDOMECHANICALS) ×4 IMPLANT
TUBING INSUFFLATION (TUBING) ×4 IMPLANT
WATER STERILE IRR 1000ML POUR (IV SOLUTION) ×4 IMPLANT

## 2017-11-22 NOTE — Progress Notes (Signed)
Patient to OR

## 2017-11-22 NOTE — Op Note (Signed)
Preoperative diagnosis: cholecystitis Postoperative diagnosis: Same as above Procedure: Laparoscopic cholecystectomy Surgical Dr. Harden Mo Anesthesia: General Specimens: Gallbladder and contents to pathology Estimated blood loss: Minimal Complications: None Drains: None Sponge needle count was correct at completion Disposition to recovery stable condition  Indications: This is a 56 yof with acute cholecystitis by exam and gallstones on an [place]. We discussed laparoscopic cholecystectomy. Bilirubin mildly elevated on admission and better this am. Discussed surgery, risks and recovery  Procedure: After informed consent was obtained the patient was taken to the operating room.  She was given antibiotics.  SCDs were placed.  She was placed under general anesthesia without complication.  Her abdomen was prepped and draped in the standard sterile surgical fashion.  Surgical timeout was then performed.  I infiltrated Marcaine below the umbilicus and made a vertical incision.  I identified the fascia and entered this sharply.  I then entered the peritoneum bluntly.  I placed a 0 Vicryl pursestring suture to the fascia.  I inserted a Hassan trocar and insufflated the abdomen to 15 mmHg pressure.  I then inserted for 3 further 5 mm trocars in the right upper quadrant and epigastrium.  These were inserted under direct vision without complication.  The gallbladder was then noted to have the duodenum and omentum adherent to it.  I lysed these adhesions with a combination of sharp sharp dissection and cautery.  I then was able to retract the gallbladder cephalad and lateral.  I dissected the triangle and was able to identify both the cystic duct and the cystic artery.  I did obtain the critical view of safety.  I then clipped the artery 3 times and divided leaving 2 clips in place.  I clipped the cystic duct 3 times and once distally. The lymph node was adherent to the common duct and the cystic duct was not  very long.   I divided this. I elected to also place an endoloop on the cystic duct. The clips traversed the duct and the duct was viable.  I then removed the gallbladder from the liver bed and placed in a bag and removed.  Hemostasis was then obtained.  I irrigated this was clear.  The clips and loop looked to be in good position.  I then remove the [facility] trocar and tied my pursestring down.  I used the suture passer to place an additional 0 Vicryl suture to obliterate this defect.  I then remove the remaining trocars and desufflated the abdomen.  These were closed with 4-0 Monocryl and glue.  She tolerated this well was extubated and transferred to the recovery room in stable condition.

## 2017-11-22 NOTE — Discharge Summary (Signed)
Physician Discharge Summary  Patient ID: Linda Aguirre MRN: [id number] DOB/AGE: 08-Jan-1996 22 y.o.  Admit date: 11/21/2017 Discharge date: 11/22/2017  Admission Diagnoses: Cholecystitis  Discharge Diagnoses:  Active Problems:   Acute cholecystitis   Discharged Condition: good  Hospital Course: 50 yof admitted with cholecystitis. Bilirubin mildly elevated, better before surgery. Other lfts normal. Underwent uncomplicated lap chole for cholecystitis and was ready for dc same day tolerating diet, ambulating with good pain control  Consults: None  Significant Diagnostic Studies: radiology: Ultrasound: gallstones  Treatments: surgery: lap chole    Disposition: Discharge disposition: 01-Home or Self Care        Allergies as of 11/22/2017      Reactions   Shellfish Allergy       Medication List    TAKE these medications   docusate sodium 100 MG capsule Commonly known as:  COLACE Take 1 capsule (100 mg total) by mouth 2 (two) times daily.   ibuprofen 600 MG tablet Commonly known as:  ADVIL,MOTRIN Take 1 tablet (600 mg total) by mouth every 6 (six) hours.   naproxen sodium 220 MG tablet Commonly known as:  ALEVE Take 660 mg by mouth daily as needed (pain).   NEXPLANON 68 MG Impl implant Generic drug:  etonogestrel 1 each by Subdermal route once. Implanted January 2018   oxyCODONE 5 MG immediate release tablet Commonly known as:  Oxy IR/ROXICODONE Take 1 tablet (5 mg total) by mouth every 4 (four) hours as needed for moderate pain.   PRENATE MINI 29-0.6-0.4-350 MG Caps Take 1 capsule by mouth daily before breakfast.      Follow-up Information    [facility] In 3 weeks.   Specialty:  General Surgery Contact information: [address] [place] [address] [phone]          Signed: [name] 11/22/2017, 7:06 PM

## 2017-11-22 NOTE — Progress Notes (Signed)
Pt discharged home with family. Pt taken down to front of hospital in w/c with staff

## 2017-11-22 NOTE — Anesthesia Procedure Notes (Signed)
Procedure Name: Intubation Date/Time: 11/22/2017 7:47 AM Performed by: Eligha Bridegroom, CRNA Pre-anesthesia Checklist: Patient identified, Emergency Drugs available, Suction available, Patient being monitored and Timeout performed Patient Re-evaluated:Patient Re-evaluated prior to induction Oxygen Delivery Method: Circle system utilized Preoxygenation: Pre-oxygenation with 100% oxygen Induction Type: IV induction, Rapid sequence and Cricoid Pressure applied Laryngoscope Size: Mac and 3 Grade View: Grade I Tube type: Oral Tube size: 7.0 mm Number of attempts: 1 Airway Equipment and Method: Stylet Placement Confirmation: ETT inserted through vocal cords under direct vision,  positive ETCO2 and breath sounds checked- equal and bilateral Secured at: 21 cm Tube secured with: Tape Dental Injury: Teeth and Oropharynx as per pre-operative assessment

## 2017-11-22 NOTE — Anesthesia Preprocedure Evaluation (Addendum)
Anesthesia Evaluation  Patient identified by MRN, date of birth, ID band Patient awake    Reviewed: Allergy & Precautions, NPO status , Patient's Chart, lab work & pertinent test results  History of Anesthesia Complications Negative for: history of anesthetic complications  Airway Mallampati: II  TM Distance: >3 FB Neck ROM: Full    Dental no notable dental hx. (+) Dental Advisory Given   Pulmonary neg pulmonary ROS,    Pulmonary exam normal        Cardiovascular negative cardio ROS Normal cardiovascular exam     Neuro/Psych negative neurological ROS     GI/Hepatic negative GI ROS, Neg liver ROS,   Endo/Other  negative endocrine ROS  Renal/GU negative Renal ROS     Musculoskeletal negative musculoskeletal ROS (+)   Abdominal   Peds  Hematology negative hematology ROS (+)   Anesthesia Other Findings Day of surgery medications reviewed with the patient.  Reproductive/Obstetrics                            Anesthesia Physical Anesthesia Plan  ASA: II  Anesthesia Plan: General   Post-op Pain Management:    Induction: Intravenous  PONV Risk Score and Plan: 4 or greater and Ondansetron, Dexamethasone, Scopolamine patch - Pre-op and Diphenhydramine  Airway Management Planned: Oral ETT  Additional Equipment:   Intra-op Plan:   Post-operative Plan: Extubation in OR  Informed Consent: I have reviewed the patients History and Physical, chart, labs and discussed the procedure including the risks, benefits and alternatives for the proposed anesthesia with the patient or authorized representative who has indicated his/her understanding and acceptance.   Dental advisory given  Plan Discussed with: CRNA, Anesthesiologist and Surgeon  Anesthesia Plan Comments:        Anesthesia Quick Evaluation

## 2017-11-22 NOTE — Transfer of Care (Signed)
Immediate Anesthesia Transfer of Care Note  Patient: Linda Aguirre  Procedure(s) Performed: LAPAROSCOPIC CHOLECYSTECTOMY (N/A Abdomen)  Patient Location: PACU  Anesthesia Type:General  Level of Consciousness: awake and alert   Airway & Oxygen Therapy: Patient Spontanous Breathing and Patient connected to nasal cannula oxygen  Post-op Assessment: Report given to RN and Post -op Vital signs reviewed and stable  Post vital signs: Reviewed and stable  Last Vitals:  Vitals Value Taken Time  BP    Temp    Pulse 98 11/22/2017  8:59 AM  Resp 17 11/22/2017  8:59 AM  SpO2 100 % 11/22/2017  8:59 AM  Vitals shown include unvalidated device data.  Last Pain:  Vitals:   11/22/17 0518  TempSrc:   PainSc: 6       Patients Stated Pain Goal: 0 (11/22/17 0518)  Complications: No apparent anesthesia complications

## 2017-11-22 NOTE — Anesthesia Postprocedure Evaluation (Signed)
Anesthesia Post Note  Patient: Linda Aguirre  Procedure(s) Performed: LAPAROSCOPIC CHOLECYSTECTOMY (N/A Abdomen)     Patient location during evaluation: PACU Anesthesia Type: General Level of consciousness: sedated Pain management: pain level controlled Vital Signs Assessment: post-procedure vital signs reviewed and stable Respiratory status: spontaneous breathing and respiratory function stable Cardiovascular status: stable Postop Assessment: no apparent nausea or vomiting Anesthetic complications: no    Last Vitals:  Vitals:   11/22/17 1030 11/22/17 1045  BP: 115/71 113/73  Pulse: 72 73  Resp: 13 10  Temp:  (!) 36.4 C  SpO2: 100% 100%    Last Pain:  Vitals:   11/22/17 1059  TempSrc:   PainSc: 6                  [name] DANIEL

## 2017-11-22 NOTE — Discharge Instructions (Signed)
[facility] LAPAROSCOPIC SURGERY: POST OP INSTRUCTIONS  Always review your discharge instruction sheet given to you by the facility where your surgery was performed. IF YOU HAVE DISABILITY OR FAMILY LEAVE FORMS, YOU MUST BRING THEM TO THE OFFICE FOR PROCESSING.   DO NOT GIVE THEM TO YOUR DOCTOR.  1. A prescription for pain medication may be given to you upon discharge.  Take your pain medication as prescribed, if needed.  If narcotic pain medicine is not needed, then you may take acetaminophen (Tylenol), naprosyn (Alleve), or ibuprofen (Advil) as needed. 2. Take your usually prescribed medications unless otherwise directed. 3. If you need a refill on your pain medication, please contact your pharmacy.  They will contact our office to request authorization. Prescriptions will not be filled after 5pm or on week-ends. 4. You should follow a light diet the first few days after arrival home, such as soup and crackers, etc.  Be sure to include lots of fluids daily. 5. Most patients will experience some swelling and bruising in the area of the incisions.  Ice packs will help.  Swelling and bruising can take several days to resolve.  6. It is common to experience some constipation if taking pain medication after surgery.  Increasing fluid intake and taking a stool softener (such as Colace) will usually help or prevent this problem from occurring.  A mild laxative (Milk of Magnesia or Miralax) should be taken according to package instructions if there are no bowel movements after 48 hours. 7. Unless discharge instructions indicate otherwise, you may remove your bandages 48 hours after surgery, and you may shower at that time.  You may have steri-strips (small skin tapes) in place directly over the incision.  These strips should be left on the skin for 7-10 days.  If your surgeon used skin glue on the incision, you may shower in 24 hours.  The glue will flake  off over the next 2-3 weeks.  Any sutures or staples will be removed at the office during your follow-up visit. 8. ACTIVITIES:  You may resume regular (light) daily activities beginning the next day--such as daily self-care, walking, climbing stairs--gradually increasing activities as tolerated.  You may have sexual intercourse when it is comfortable.  Refrain from any heavy lifting or straining until approved by your doctor. a. You may drive when you are no longer taking prescription pain medication, you can comfortably wear a seatbelt, and you can safely maneuver your car and apply brakes. b. RETURN TO WORK:  __________________________________________________________ 9. You should see your doctor in the office for a follow-up appointment approximately 2-3 weeks after your surgery.  Make sure that you call for this appointment within a day or two after you arrive home to insure a convenient appointment time. 10. OTHER INSTRUCTIONS: __________________________________________________________________________________________________________________________ __________________________________________________________________________________________________________________________ WHEN TO CALL YOUR DOCTOR: 1. Fever over 101.0 2. Inability to urinate 3. Continued bleeding from incision. 4. Increased pain, redness, or drainage from the incision. 5. Increasing abdominal pain  The clinic staff is available to answer your questions during regular business hours.  Please don't hesitate to call and ask to speak to one of the nurses for clinical concerns.  If you have a medical emergency, go to the nearest emergency room or call 911.  A surgeon from [facility] is always on call at the hospital. [address], [address], [place], [place]  [address] ? [address] [address], [place], [place]   [address] [phone] ? [phone] ? [phone] Web site: [email or web address]  

## 2017-11-22 NOTE — Interval H&P Note (Signed)
History and Physical Interval Note:  11/22/2017 7:33 AM  Linda Aguirre  has presented today for surgery, with the diagnosis of ACUTE CHOLECYSTITIS  The various methods of treatment have been discussed with the patient and family. After consideration of risks, benefits and other options for treatment, the patient has consented to  Procedure(s): LAPAROSCOPIC CHOLECYSTECTOMY WITH POSSIBLE  INTRAOPERATIVE CHOLANGIOGRAM (N/A) as a surgical intervention .  The patient's history has been reviewed, patient examined, no change in status, stable for surgery.  I have reviewed the patient's chart and labs.  Questions were answered to the patient's satisfaction.     [name]

## 2017-11-23 ENCOUNTER — Encounter (HOSPITAL_COMMUNITY): Payer: Self-pay | Admitting: General Surgery
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
## 2019-01-03 IMAGING — US US PELV - US TRANSVAGINAL
1 series · 15 of 25 positions shown · non-contrast
Comparison: None.

CLINICAL DATA: Pelvic pain.

EXAM:
TRANSABDOMINAL AND TRANSVAGINAL ULTRASOUND OF PELVIS
DOPPLER ULTRASOUND OF OVARIES
TECHNIQUE: Both transabdominal and transvaginal ultrasound examinations of the
pelvis were performed. Transabdominal technique was performed for
global imaging of the pelvis including uterus, ovaries, adnexal
regions, and pelvic cul-de-sac.
It was necessary to proceed with endovaginal exam following the
transabdominal exam to visualize the endometrium. Color and duplex
Doppler ultrasound was utilized to evaluate blood flow to the
ovaries.

[Series 1: us pelv - us transvaginal · 15 of 54 slices shown]
[im 1/54]
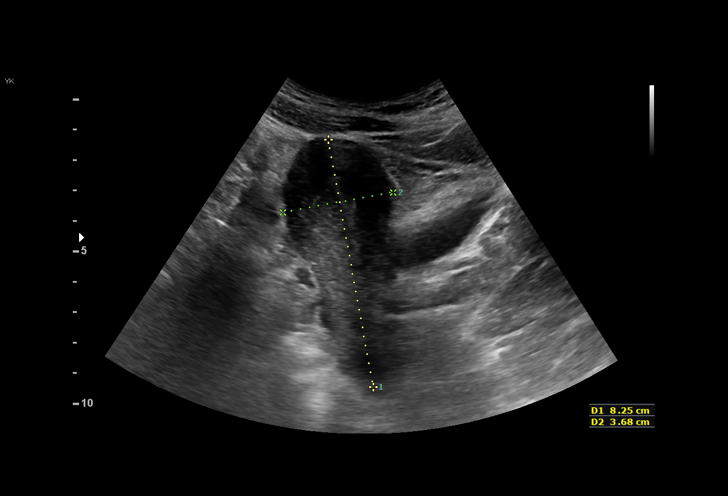
[im 5/54]
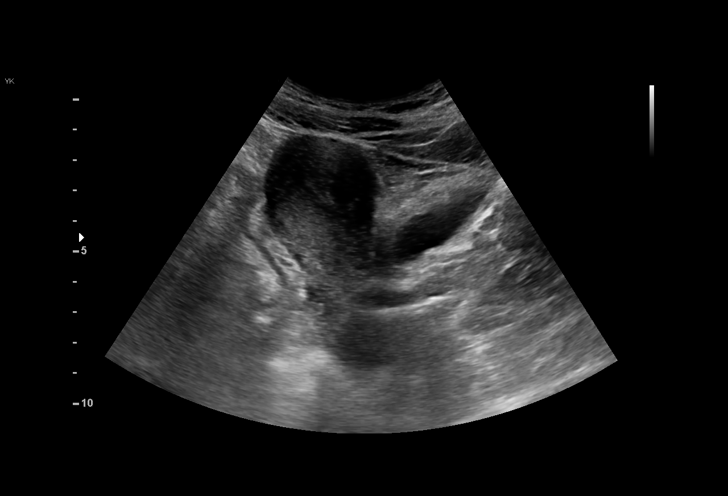
[im 9/54]
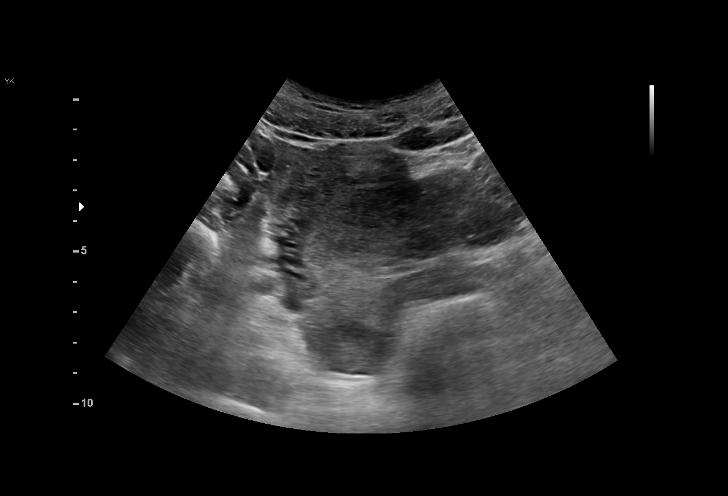
[im 12/54]
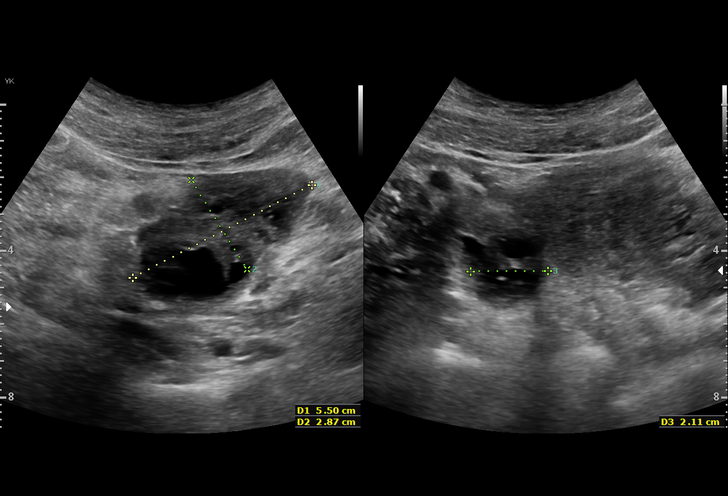
[im 16/54]
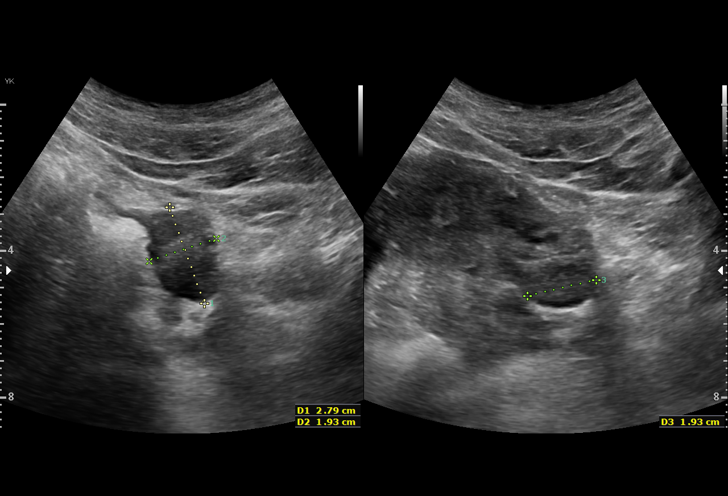
[im 20/54]
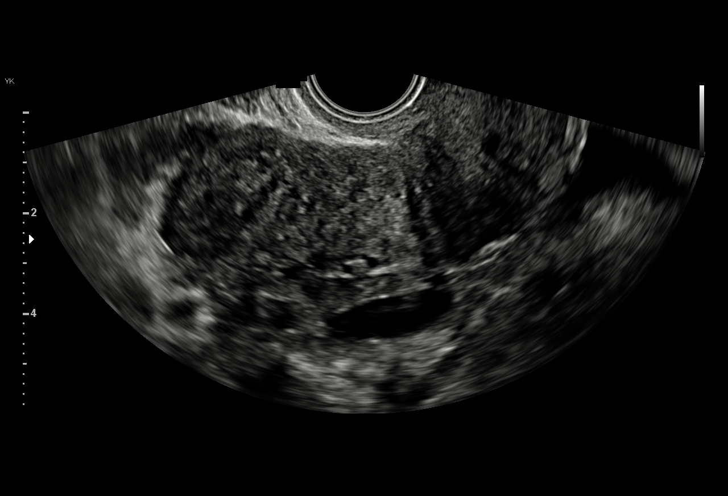
[im 23/54]
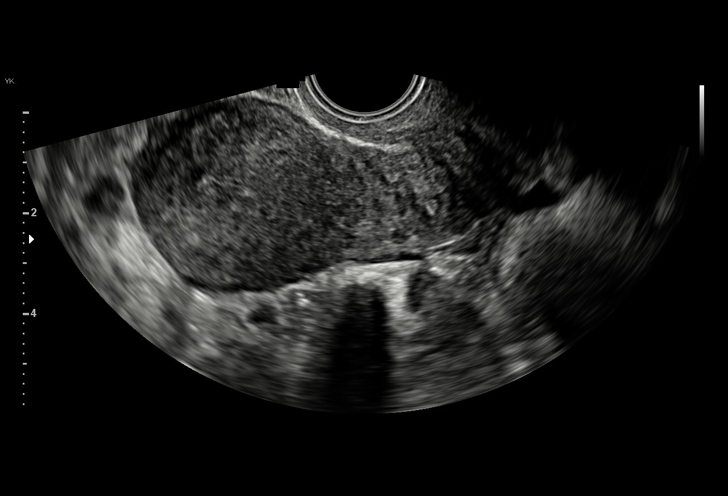
[im 27/54]
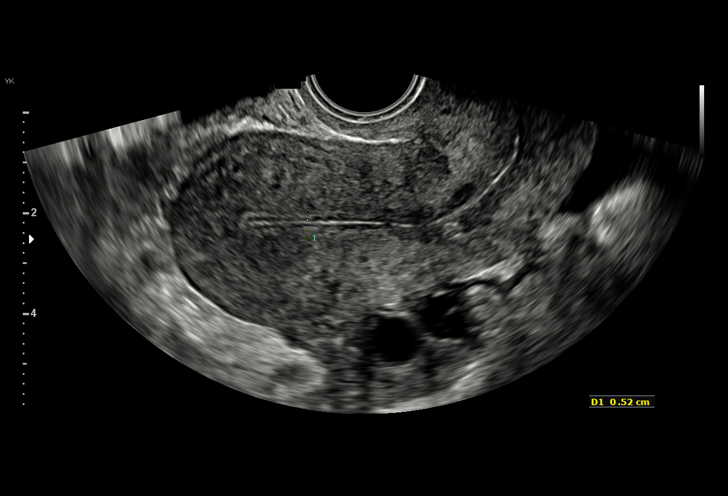
[im 31/54]
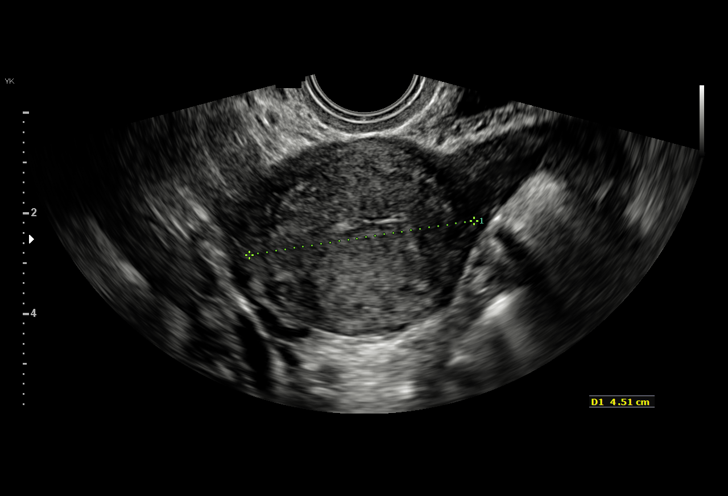
[im 34/54]
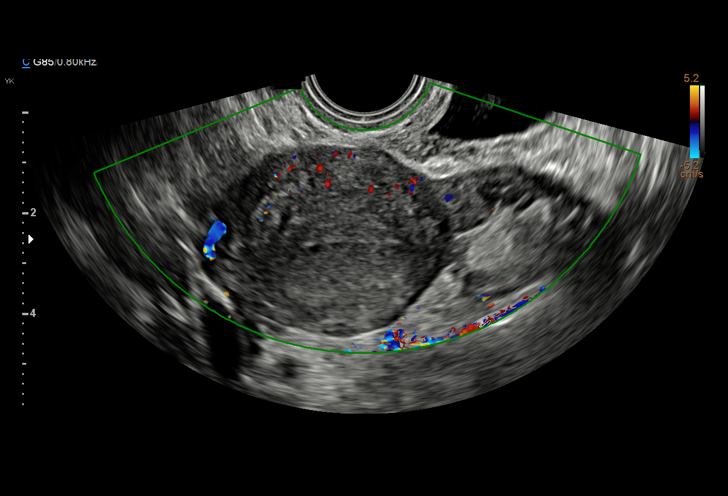
[im 38/54]
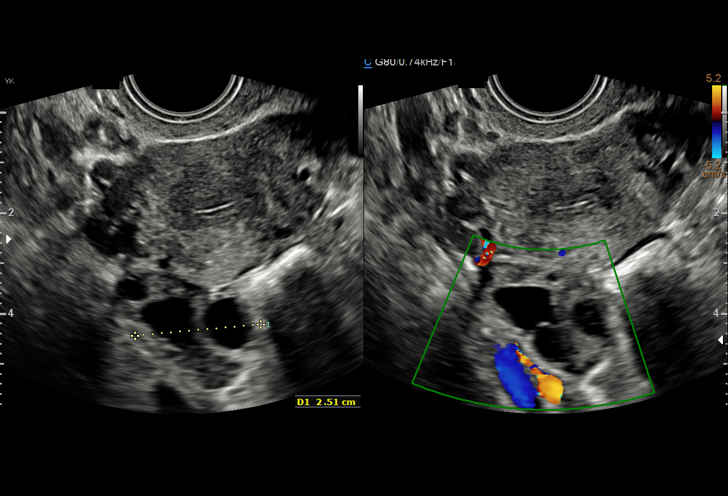
[im 42/54]
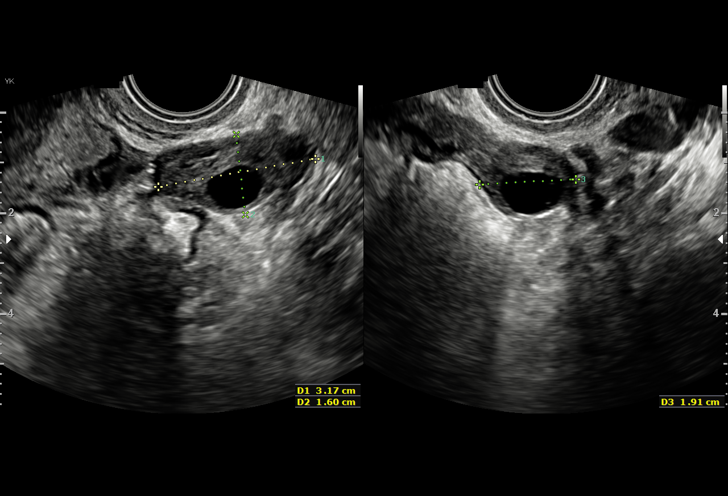
[im 45/54]
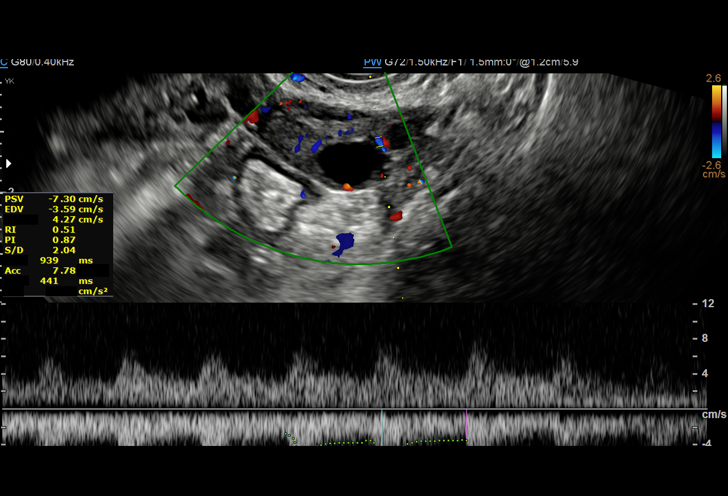
[im 49/54]
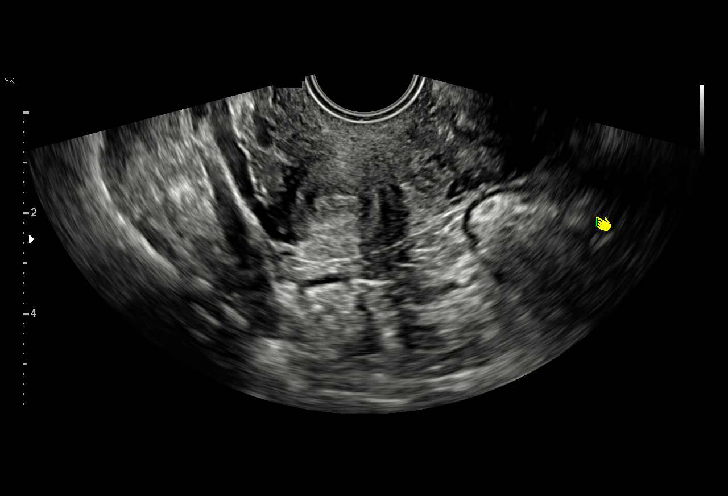
[im 54/54]
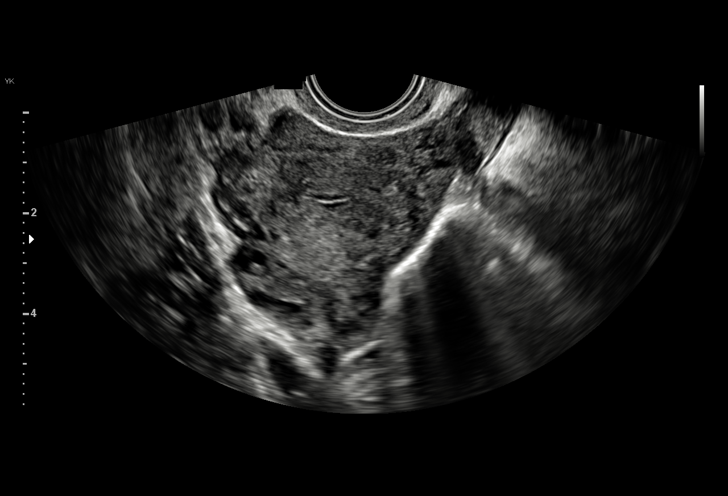

[15 of 25 positions shown; findings below may reference images not displayed]

FINDINGS: Uterus

Measurements: 8.3 x 3.7 by 4.2 cm. No fibroids or other mass
visualized.

Endometrium

Thickness: 5 mm.  No focal abnormality visualized.

Right ovary

Measurements: 3.1 x 1.5 x 2.5 cm. Normal appearance/no adnexal mass.

Left ovary

Measurements: 3.2 x 1.6 x 1.9 cm. Normal appearance/no adnexal mass.

Pulsed Doppler evaluation of both ovaries demonstrates normal
low-resistance arterial and venous waveforms.

Other findings

Small amount of free fluid in the pelvis.
IMPRESSION: Normal appearance of the uterus and bilateral ovaries.

Small amount of free fluid in the pelvis.

## 2019-02-28 ENCOUNTER — Emergency Department (HOSPITAL_BASED_OUTPATIENT_CLINIC_OR_DEPARTMENT_OTHER)
Admission: EM | Admit: 2019-02-28 | Discharge: 2019-02-28 | Disposition: A | Discharge status: Home / Self Care | Payer: Medicaid Other | Attending: Emergency Medicine | Admitting: Emergency Medicine

## 2019-02-28 ENCOUNTER — Other Ambulatory Visit: Payer: Self-pay

## 2019-02-28 ENCOUNTER — Encounter (HOSPITAL_BASED_OUTPATIENT_CLINIC_OR_DEPARTMENT_OTHER): Payer: Self-pay | Admitting: *Deleted

## 2019-02-28 DIAGNOSIS — Z793 Long term (current) use of hormonal contraceptives: Secondary | ICD-10-CM | POA: Insufficient documentation

## 2019-02-28 DIAGNOSIS — T7421XA Adult sexual abuse, confirmed, initial encounter: Secondary | ICD-10-CM

## 2019-02-28 DIAGNOSIS — Z91013 Allergy to seafood: Secondary | ICD-10-CM | POA: Insufficient documentation

## 2019-02-28 DIAGNOSIS — N939 Abnormal uterine and vaginal bleeding, unspecified: Secondary | ICD-10-CM | POA: Insufficient documentation

## 2019-02-28 LAB — PREGNANCY, URINE: Preg Test, Ur: NEGATIVE

## 2019-02-28 MED ORDER — METRONIDAZOLE 500 MG PO TABS
2000.0000 mg | ORAL_TABLET | Freq: Once | ORAL | Status: AC
  Administered 2019-02-28: 17:00:00 2000 mg via ORAL
  Filled 2019-02-28: qty 4

## 2019-02-28 MED ORDER — LIDOCAINE HCL (PF) 1 % IJ SOLN
0.9000 mL | Freq: Once | INTRAMUSCULAR | Status: AC
  Administered 2019-02-28: 17:00:00 0.9 mL
  Filled 2019-02-28: qty 5

## 2019-02-28 MED ORDER — AZITHROMYCIN 250 MG PO TABS
1000.0000 mg | ORAL_TABLET | Freq: Once | ORAL | Status: AC
  Administered 2019-02-28: 17:00:00 1000 mg via ORAL
  Filled 2019-02-28: qty 4

## 2019-02-28 MED ORDER — CEFTRIAXONE SODIUM 500 MG IJ SOLR: 250.0000 mg | Freq: Once | INTRAMUSCULAR | Status: DC

## 2019-02-28 MED ORDER — CEFTRIAXONE SODIUM 500 MG IJ SOLR
INTRAMUSCULAR | Status: AC
  Administered 2019-02-28: 500 mg via INTRAMUSCULAR
  Filled 2019-02-28: qty 500

## 2019-02-28 MED ORDER — CEFTRIAXONE SODIUM 500 MG IJ SOLR: 500.0000 mg | Freq: Once | INTRAMUSCULAR | Status: AC
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
## 2019-02-28 NOTE — ED Notes (Signed)
Pt with SANE RN
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
## 2019-09-12 IMAGING — US US ABDOMEN LIMITED
1 series · 14 of 25 positions shown · non-contrast
Comparison: None.

CLINICAL DATA: Right upper quadrant pain and nausea for 2 weeks

EXAM:
ULTRASOUND ABDOMEN LIMITED RIGHT UPPER QUADRANT

[Series 1: us abdomen limited · 0.22mm/px · 14 of 60 slices shown]
[im 1/60]
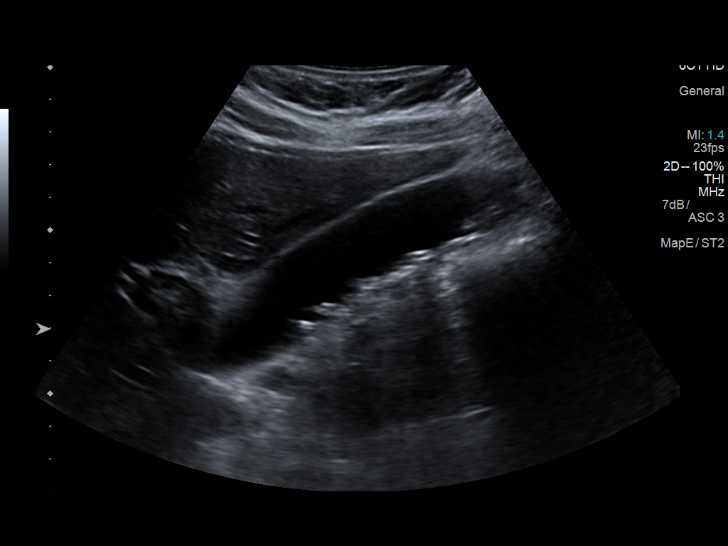
[im 5/60]
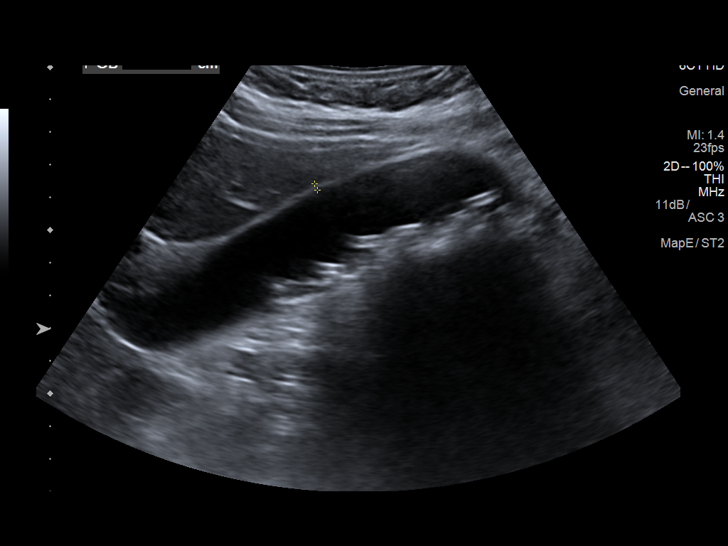
[im 10/60]
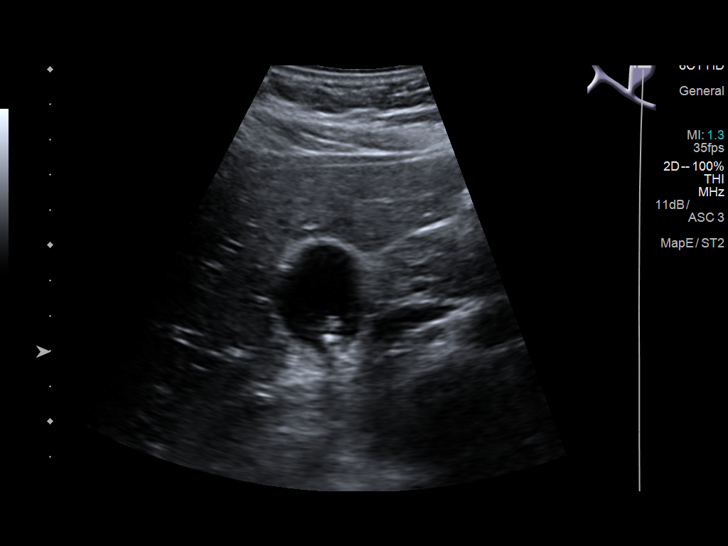
[im 15/60]
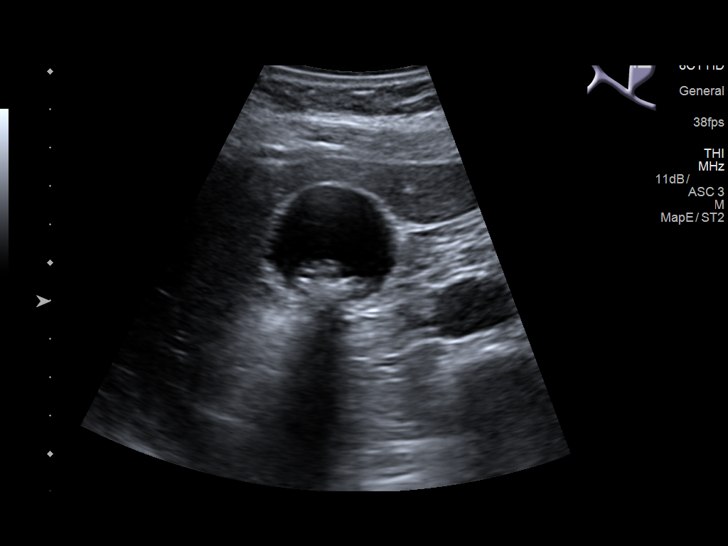
[im 20/60]
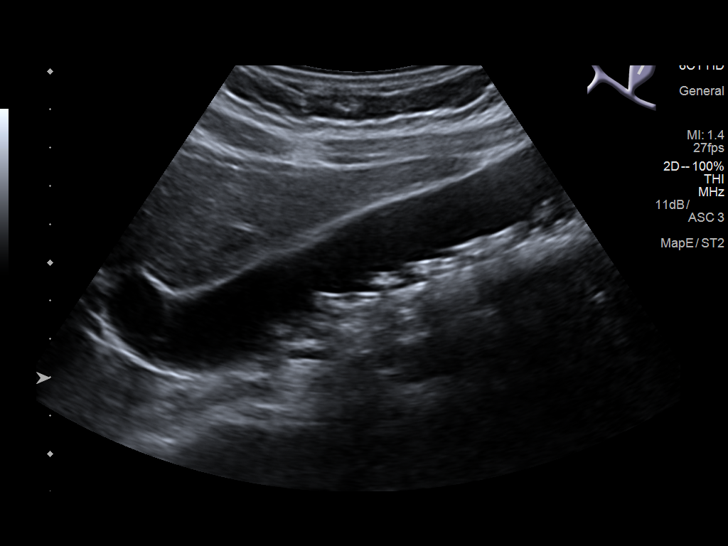
[im 23/60]
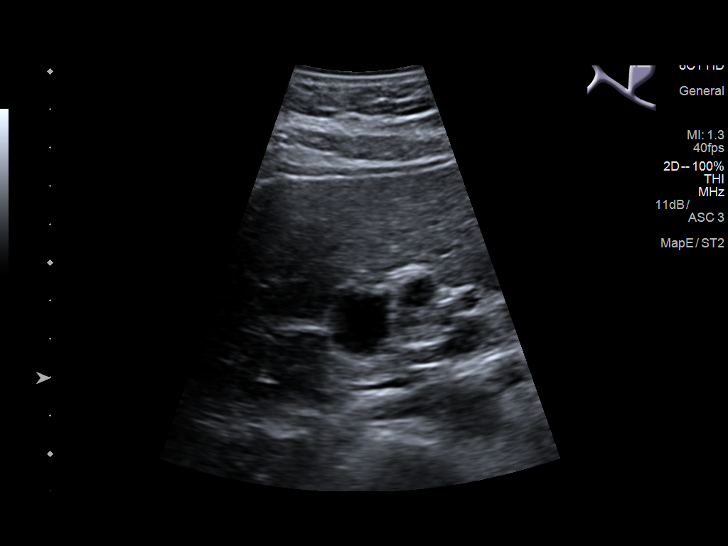
[im 28/60]
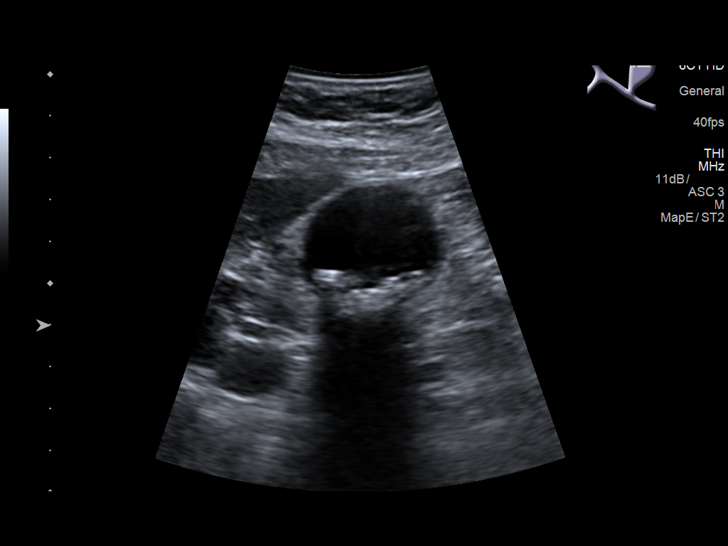
[im 32/60]
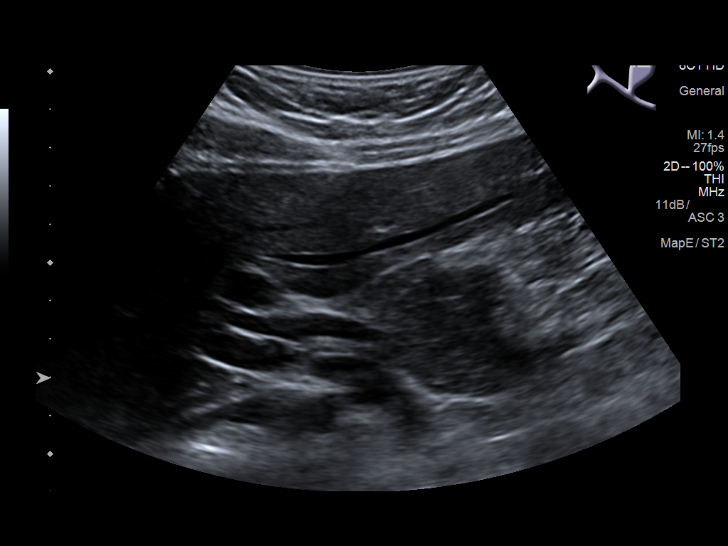
[im 37/60]
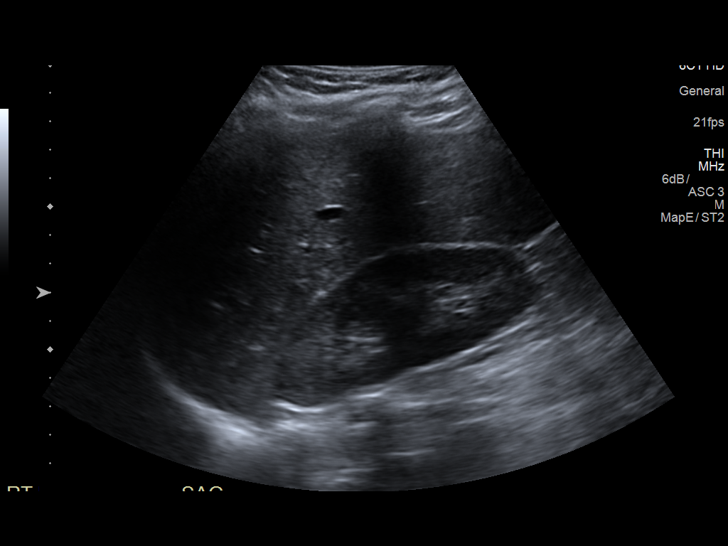
[im 40/60]
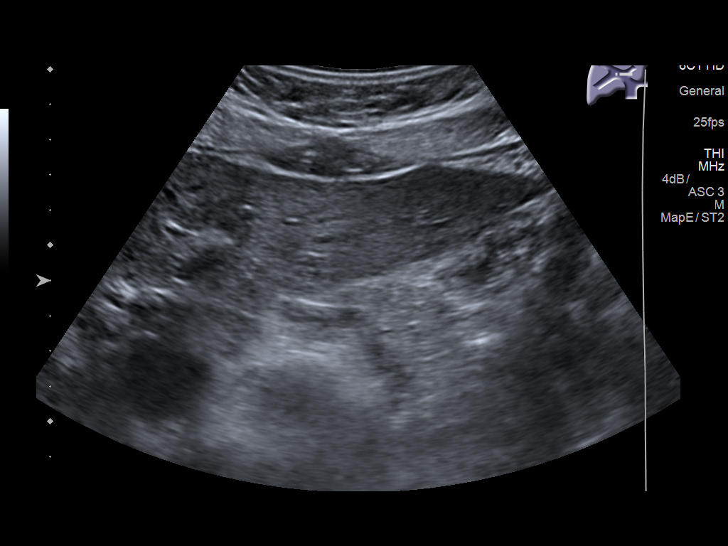
[im 45/60]
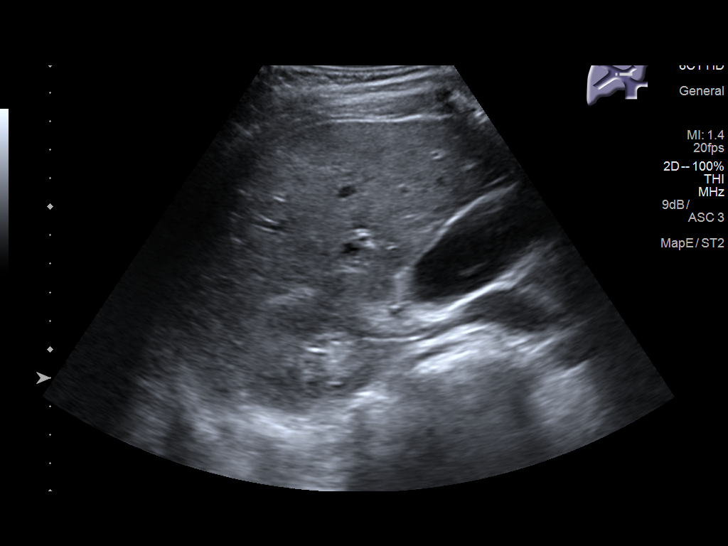
[im 50/60]
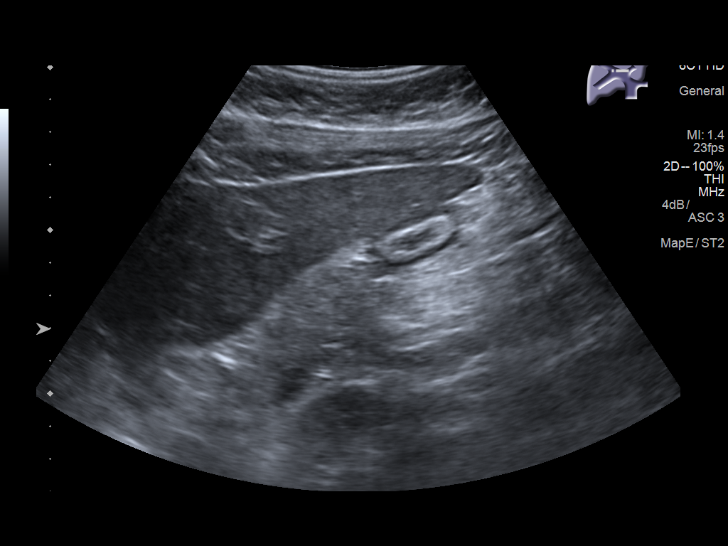
[im 55/60]
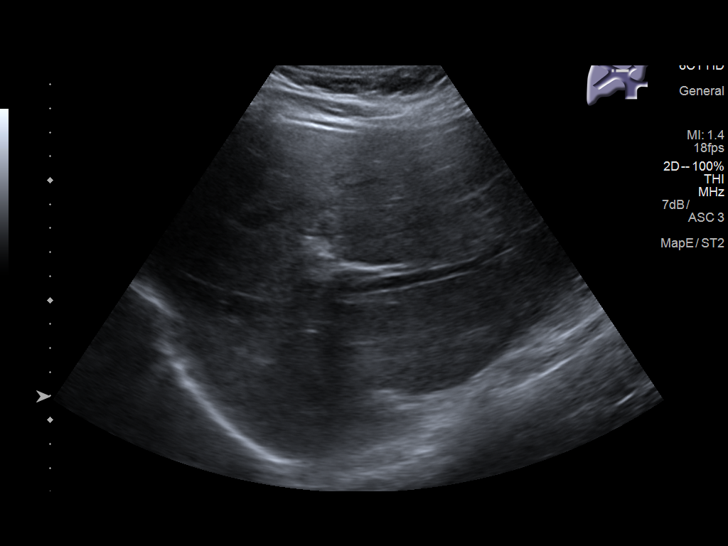
[im 60/60]
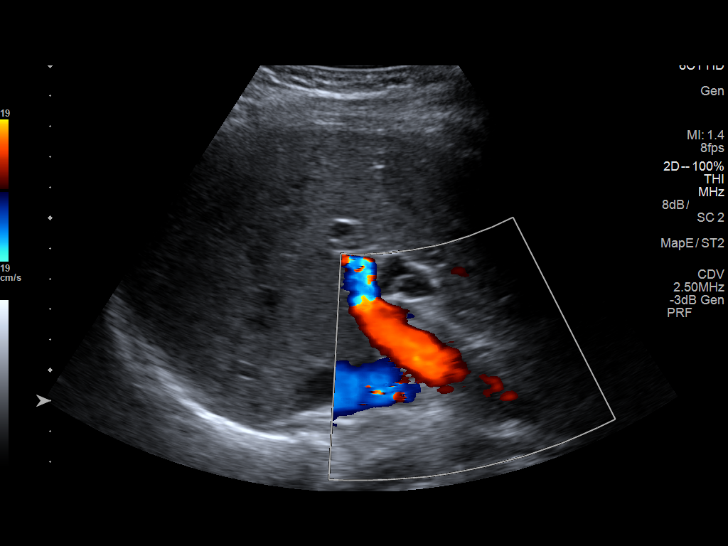

[14 of 25 positions shown; findings below may reference images not displayed]

FINDINGS: Gallbladder:

There are multiple gallstones present measuring up to 6.6 mm. There
is no pericholecystic fluid or gallbladder wall thickening.
Sonographic Murphy sign could not be assessed because the patient
had already received pain medication.

Common bile duct:

Diameter: 5 mm

Liver:

No focal lesion identified. Within normal limits in parenchymal
echogenicity. Portal vein is patent on color Doppler imaging with
normal direction of blood flow towards the liver.
IMPRESSION: Cholelithiasis without other evidence of acute cholecystitis. Unable
to assess sonographic Murphy sign due to previously administered
pain medication.

## 2019-12-11 ENCOUNTER — Other Ambulatory Visit: Payer: Self-pay

## 2019-12-11 ENCOUNTER — Emergency Department (HOSPITAL_COMMUNITY)
Admission: EM | Admit: 2019-12-11 | Discharge: 2019-12-11 | Disposition: A | Discharge status: Home / Self Care | Payer: Self-pay | Attending: Emergency Medicine | Admitting: Emergency Medicine

## 2019-12-11 ENCOUNTER — Encounter (HOSPITAL_COMMUNITY): Payer: Self-pay | Admitting: Emergency Medicine

## 2019-12-11 DIAGNOSIS — M79622 Pain in left upper arm: Secondary | ICD-10-CM | POA: Insufficient documentation

## 2019-12-11 DIAGNOSIS — M62830 Muscle spasm of back: Secondary | ICD-10-CM | POA: Insufficient documentation

## 2019-12-11 DIAGNOSIS — Z975 Presence of (intrauterine) contraceptive device: Secondary | ICD-10-CM | POA: Insufficient documentation

## 2019-12-11 MED ORDER — CYCLOBENZAPRINE HCL 10 MG PO TABS: 10.0000 mg | ORAL_TABLET | Freq: Three times a day (TID) | ORAL | 0 refills | Status: AC

## 2019-12-11 NOTE — ED Triage Notes (Signed)
Pt arrives to ED with left arm pain which she thinks it from her birth control in her arm. She has had this over 3 years but due to lack of insurance has been unable to get this out.

## 2019-12-11 NOTE — ED Provider Notes (Signed)
[facility] EMERGENCY DEPARTMENT Provider Note   CSN: [id number] Arrival date & time: 12/11/19  1733     History Chief Complaint  Patient presents with  . Arm Pain    Linda Aguirre is a 24 y.o. female presents to the ED for evaluation of left arm pain and back spasms.  States today she noticed soreness in the left upper arm around the area where her Nexplanon implant is.  States she has had this for 3 years and it has never given her any issues or pain.  Denies any local trauma, redness, warmth.  No distal arm tingling, loss of sensation.  She would like this to be removed.  States this was inserted at the [facility] after she gave birth almost 3 years ago.  Also reports middle back "spasms".  This has happened to her for a long time but states for the last couple of days she has noticed that they have been more frequent.  Denies any falls, trauma to her back.  Denies any urinary symptoms, abdominal pain.  No leg tingling or weakness.  No difficulty with bladder or bowel control.  No interventions. HPI     Past Medical History:  Diagnosis Date  . Chlamydia contact, treated   . H/O varicella     Patient Active Problem List   Diagnosis Date Noted  . Acute cholecystitis 11/21/2017  . NSVD (normal spontaneous vaginal delivery) 02/10/2016  . Nexplanon insertion 02/10/2016  . Pregnancy 02/07/2016  . Chlamydia infection affecting pregnancy in third trimester 01/24/2016  . Rh negative state in antepartum period 01/24/2016  . GBS (group B Streptococcus carrier), +RV culture, currently pregnant 01/18/2016  . Anemia affecting pregnancy in third trimester 12/11/2015  . Encounter for supervision of normal pregnancy 11/20/2015  . Pregnancy test positive 08/02/2014  . Short interval between pregnancies affecting pregnancy in third trimester, antepartum 08/02/2014  . Teen pregnancy 08/02/2014  . Adolescent pregnancy 04/10/2014  . Maternal varicella, non-immune  10/22/2013    Past Surgical History:  Procedure Laterality Date  . CHOLECYSTECTOMY N/A 11/22/2017   Procedure: LAPAROSCOPIC CHOLECYSTECTOMY;  Surgeon: [name], MD;  Location: [facility] OR;  Service: General;  Laterality: N/A;  . NO PAST SURGERIES       OB History    Gravida  2   Para  2   Term  2   Preterm      AB      Living  2     SAB      TAB      Ectopic      Multiple  0   Live Births  2           Family History  Problem Relation Age of Onset  . Asthma Brother     Social History   Tobacco Use  . Smoking status: Never Smoker  . Smokeless tobacco: Never Used  Substance Use Topics  . Alcohol use: No  . Drug use: Yes    Types: Marijuana    Comment: last use two weeks ago( July 2015)    Home Medications Prior to Admission medications   Medication Sig Start Date End Date Taking? Authorizing Provider  cyclobenzaprine (FLEXERIL) 10 MG tablet Take 1 tablet (10 mg total) by mouth 3 (three) times daily for 7 days. 12/11/19 12/18/19  Liberty Handy, PA-C  docusate sodium (COLACE) 100 MG capsule Take 1 capsule (100 mg total) by mouth 2 (two) times daily. Patient not taking: Reported on 11/25/2016  02/10/16   Mumaw, Hiram Comber, DO  etonogestrel (NEXPLANON) 68 MG IMPL implant 1 each by Subdermal route once. Implanted January 2018    [provider]  ibuprofen (ADVIL,MOTRIN) 600 MG tablet Take 1 tablet (600 mg total) by mouth every 6 (six) hours. Patient not taking: Reported on 11/21/2017 02/10/16   Mumaw, Hiram Comber, DO  naproxen sodium (ALEVE) 220 MG tablet Take 660 mg by mouth daily as needed (pain).    [provider]  oxyCODONE (OXY IR/ROXICODONE) 5 MG immediate release tablet Take 1 tablet (5 mg total) by mouth every 4 (four) hours as needed for moderate pain. 11/22/17   [name], MD  Prenat w/o A-FeCbn-Meth-FA-DHA (PRENATE MINI) 29-0.6-0.4-350 MG CAPS Take 1 capsule by mouth daily before breakfast. Patient not  taking: Reported on 11/25/2016 08/02/15   Brock Bad, MD    Allergies    Shellfish allergy  Review of Systems   Review of Systems  Musculoskeletal: Positive for myalgias (back spasms).       Arm pain  All other systems reviewed and are negative.   Physical Exam Updated Vital Signs BP 130/70 (BP Location: Right Arm)   Pulse 99   Temp 100 F (37.8 C) (Oral)   Resp 16   SpO2 100%   Physical Exam Constitutional:      Appearance: She is well-developed.  HENT:     Head: Normocephalic.     Nose: Nose normal.  Eyes:     General: Lids are normal.  Cardiovascular:     Rate and Rhythm: Normal rate.     Comments: 1+ radial pulse in LUE  Pulmonary:     Effort: Pulmonary effort is normal. No respiratory distress.  Abdominal:     Palpations: Abdomen is soft.     Tenderness: There is no abdominal tenderness.     Comments: No CVA tenderness   Musculoskeletal:        General: Normal range of motion.     Cervical back: Normal range of motion.     Comments: TL spine: no midline or paraspinal tenderness. No obvious abnormalities on the back. Patient able to sit up without difficulty   Skin:    Comments: LUE: skin normal over left bicep. Palpated linear foreign body subcutaneous tissue consistent with Nexplanon, minimally tender but no erythema, warmth, wounds, fluctuance. Compartments soft in the arm. No pain with passive ROM of left upper extremity joints   Neurological:     Mental Status: She is alert.     Comments: Sensation in LUE in radial, medial, ulnar nerve distribution intact   Psychiatric:        Behavior: Behavior normal.     ED Results / Procedures / Treatments   Labs (all labs ordered are listed, but only abnormal results are displayed) Labs Reviewed - No data to display  EKG None  Radiology No results found.  Procedures Procedures (including critical care time)  Medications Ordered in ED Medications - No data to display  ED Course  I have reviewed  the triage vital signs and the nursing notes.  Pertinent labs & imaging results that were available during my care of the patient were reviewed by me and considered in my medical decision making (see chart for details).    MDM Rules/Calculators/A&P                          24 yo F here for pain around Nexplanon implant onset this  morning, hoping to get it removed.  Also reporting intermittent "spasms" around thoracic back. No trauma. No red flags like fall, trauma, abdominal pain, urinary symptoms. Exam benign, skin normal no midline or reproducible tenderness.  Skin over nexplanon normal, no signs of infection. No direct trauma to nexplanon area.  Doubt break.  Will dc with flexeril, NSAIDs, heating pad for muscular spasms of back. Given OBGYN office contact information for discussion of nexplanon removal/birth control.    Final Clinical Impression(s) / ED Diagnoses Final diagnoses:  Back spasm  Nexplanon in place    Rx / DC Orders ED Discharge Orders         Ordered    cyclobenzaprine (FLEXERIL) 10 MG tablet  3 times daily        12/11/19 1844           Liberty Handy, PA-C 12/11/19 1846    Alvira Monday, MD 12/12/19 [phone]

## 2019-12-11 NOTE — Discharge Instructions (Addendum)
You were seen in the ED for arm pain around the Nexplanon implant and back spasms  Call OB/GYN office and make an appointment for discussion of removal of the Nexplanon  Take ibuprofen or acetaminophen for pain, ice.  Your back pain/spasms may be related to overexertion, muscular strain.  Take ibuprofen or acetaminophen as needed.  Heating pad can help.  Over-the-counter Voltaren gel massage can also help.  Take Flexeril as needed for spasms.  Return to the ED for worsening constant back pain, urinary symptoms, abdominal pain, fevers, sudden onset loss of control of your bowels or bladder, numbness or weakness in your extremities
